# Patient Record
Sex: Female | Born: 1945 | Race: White | Hispanic: No | Smoking: Never smoker
Health system: Southern US, Community
[De-identification: ages and names within clinical notes are randomized; demographics above are authoritative.]

## PROBLEM LIST (undated history)

## (undated) DIAGNOSIS — M2550 Pain in unspecified joint: Secondary | ICD-10-CM

## (undated) DIAGNOSIS — I4892 Unspecified atrial flutter: Secondary | ICD-10-CM

## (undated) DIAGNOSIS — E041 Nontoxic single thyroid nodule: Secondary | ICD-10-CM

## (undated) DIAGNOSIS — M549 Dorsalgia, unspecified: Secondary | ICD-10-CM

## (undated) DIAGNOSIS — R531 Weakness: Secondary | ICD-10-CM

## (undated) DIAGNOSIS — E785 Hyperlipidemia, unspecified: Secondary | ICD-10-CM

## (undated) DIAGNOSIS — C4490 Unspecified malignant neoplasm of skin, unspecified: Secondary | ICD-10-CM

## (undated) DIAGNOSIS — G4730 Sleep apnea, unspecified: Secondary | ICD-10-CM

## (undated) DIAGNOSIS — G8929 Other chronic pain: Secondary | ICD-10-CM

## (undated) DIAGNOSIS — Z9109 Other allergy status, other than to drugs and biological substances: Secondary | ICD-10-CM

## (undated) DIAGNOSIS — I10 Essential (primary) hypertension: Secondary | ICD-10-CM

## (undated) DIAGNOSIS — M1990 Unspecified osteoarthritis, unspecified site: Secondary | ICD-10-CM

## (undated) DIAGNOSIS — K219 Gastro-esophageal reflux disease without esophagitis: Secondary | ICD-10-CM

## (undated) DIAGNOSIS — Z860100 Personal history of colon polyps, unspecified: Secondary | ICD-10-CM

## (undated) HISTORY — DX: Nontoxic single thyroid nodule: E04.1

## (undated) HISTORY — PX: BREAST BIOPSY: SHX20

## (undated) HISTORY — DX: Sleep apnea, unspecified: G47.30

## (undated) HISTORY — DX: Other allergy status, other than to drugs and biological substances: Z91.09

## (undated) HISTORY — PX: BASAL CELL CARCINOMA EXCISION: SHX1214

## (undated) HISTORY — DX: Unspecified osteoarthritis, unspecified site: M19.90

## (undated) HISTORY — DX: Unspecified malignant neoplasm of skin, unspecified: C44.90

## (undated) HISTORY — PX: CERVICAL FUSION: SHX112

## (undated) HISTORY — PX: DILATION AND CURETTAGE OF UTERUS: SHX78

## (undated) HISTORY — PX: ESOPHAGOGASTRODUODENOSCOPY: SHX1529

## (undated) HISTORY — DX: Hyperlipidemia, unspecified: E78.5

## (undated) HISTORY — PX: BREAST SURGERY: SHX581

## (undated) HISTORY — PX: COLONOSCOPY: SHX174

## (undated) HISTORY — DX: Unspecified atrial flutter: I48.92

## (undated) HISTORY — DX: Essential (primary) hypertension: I10

---

## 2009-05-07 ENCOUNTER — Emergency Department: Payer: Self-pay | Admitting: Emergency Medicine

## 2009-05-14 ENCOUNTER — Ambulatory Visit: Payer: Self-pay | Admitting: General Surgery

## 2010-03-10 ENCOUNTER — Ambulatory Visit: Payer: Self-pay | Admitting: Gastroenterology

## 2010-10-26 ENCOUNTER — Ambulatory Visit: Payer: Self-pay | Admitting: Internal Medicine

## 2012-06-12 ENCOUNTER — Ambulatory Visit: Payer: Self-pay | Admitting: Otolaryngology

## 2012-06-12 IMAGING — US THYROID ULTRASOUND
1 series · 14 of 25 positions shown · non-contrast
Comparison: none

REASON FOR EXAM: yearly follow goiter
COMMENTS:

[Series 1: thyroid ultrasound · 0.09mm/px · 14 of 47 slices shown]
[im 1/47]
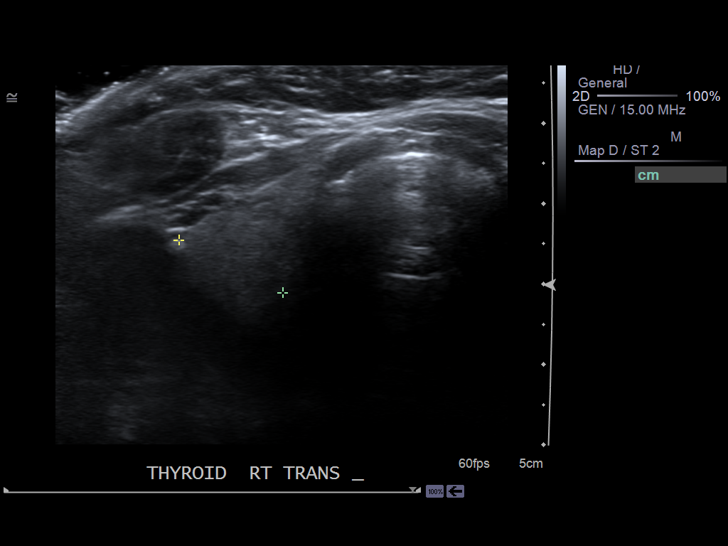
[im 4/47]
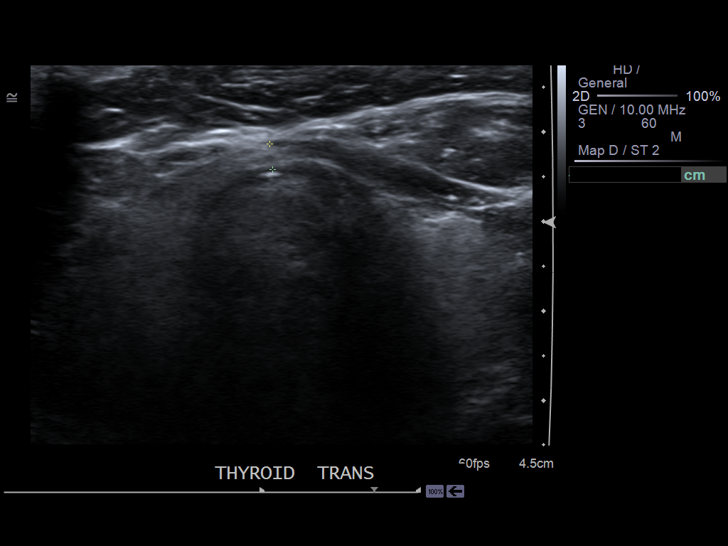
[im 8/47]
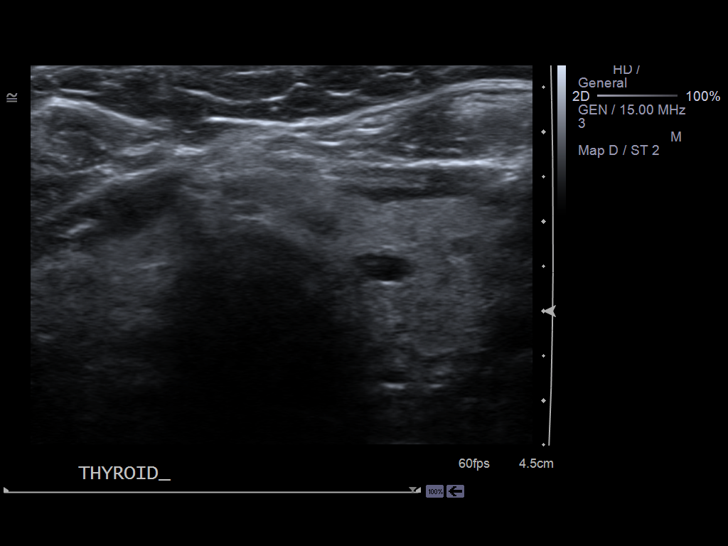
[im 12/47]
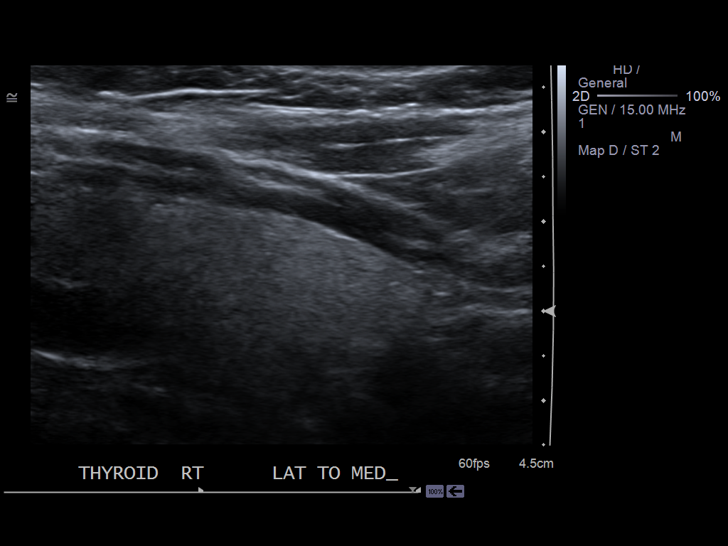
[im 16/47]
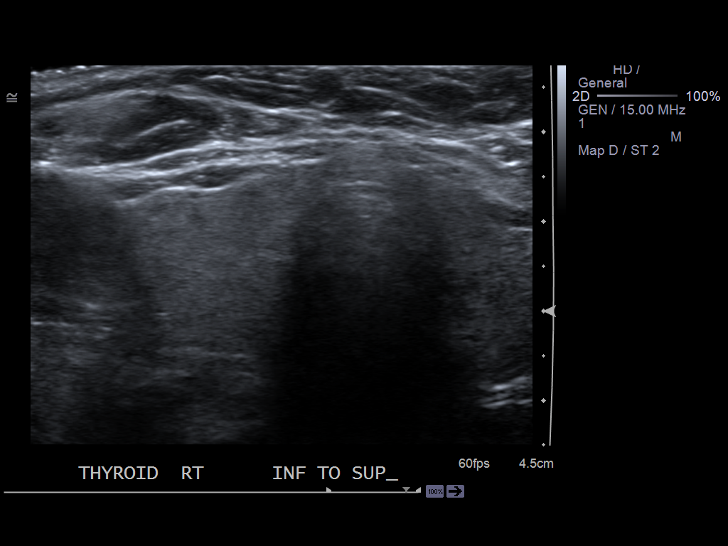
[im 18/47]
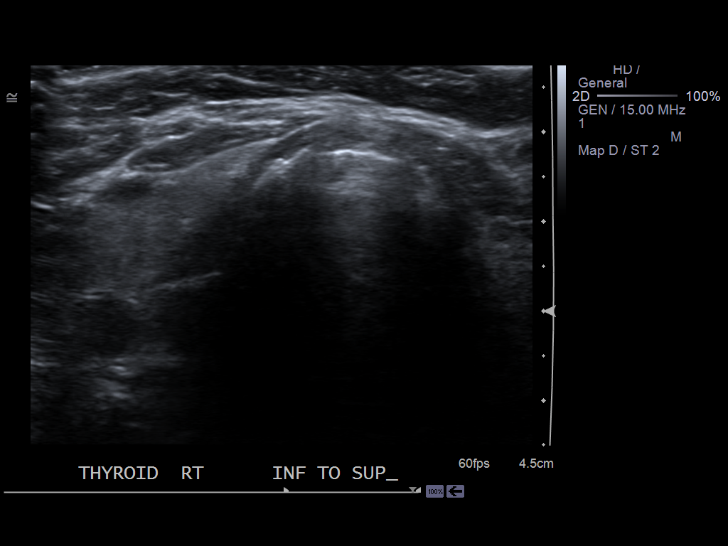
[im 22/47]
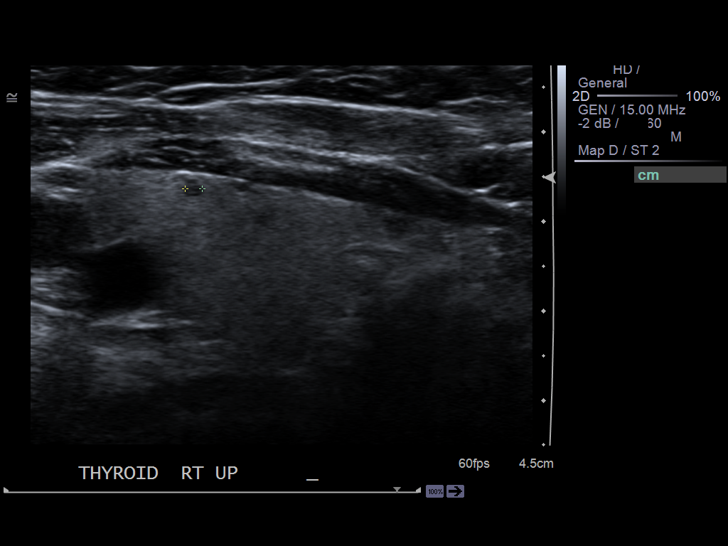
[im 25/47]
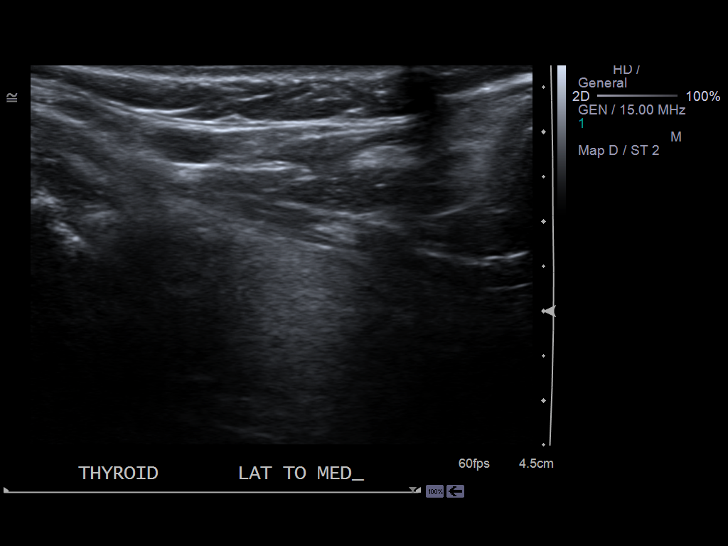
[im 29/47]
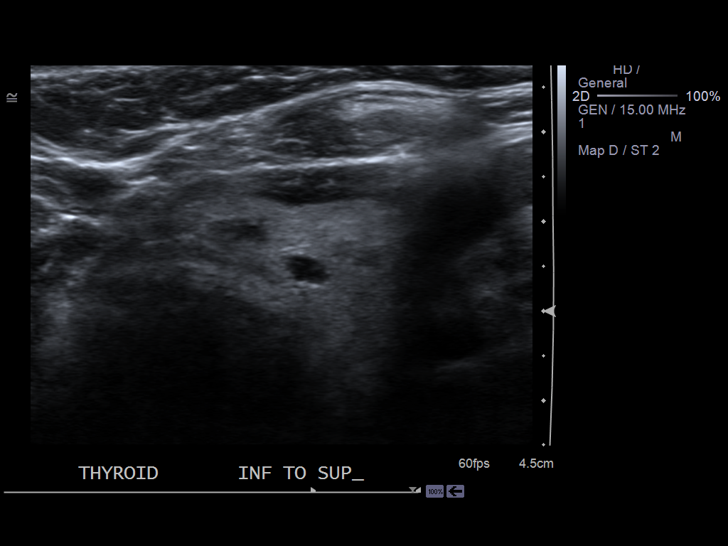
[im 31/47]
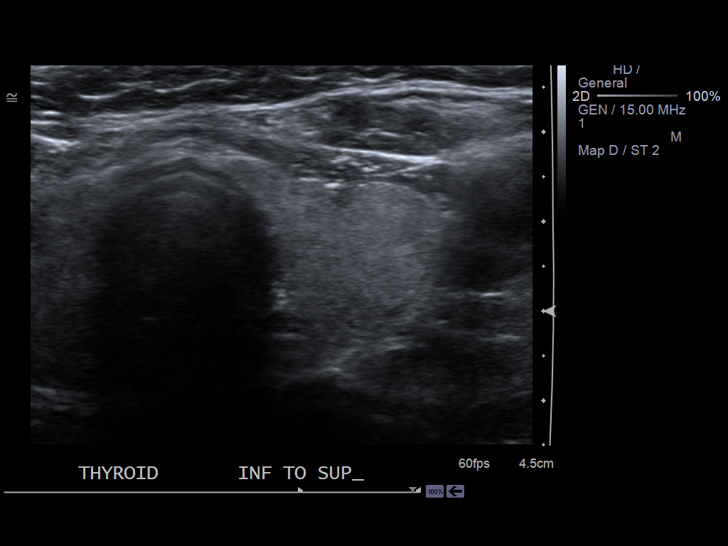
[im 35/47]
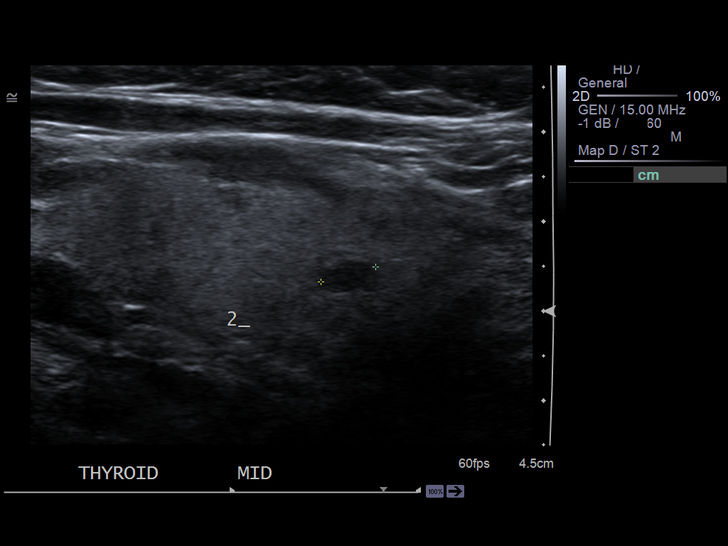
[im 39/47]
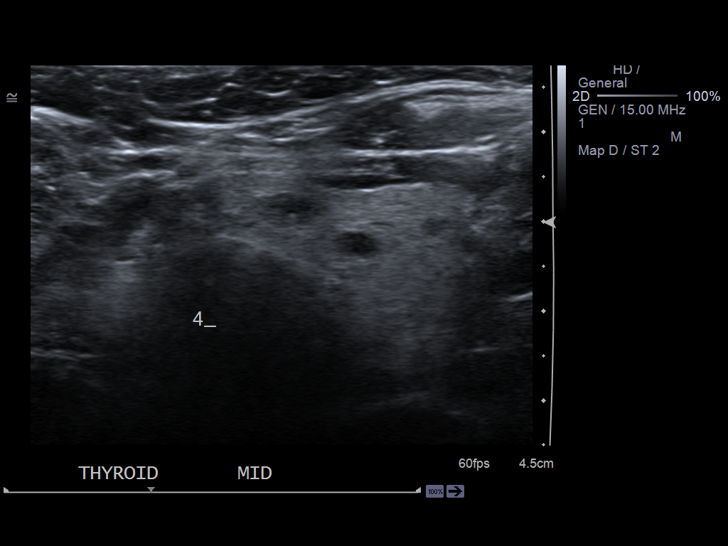
[im 43/47]
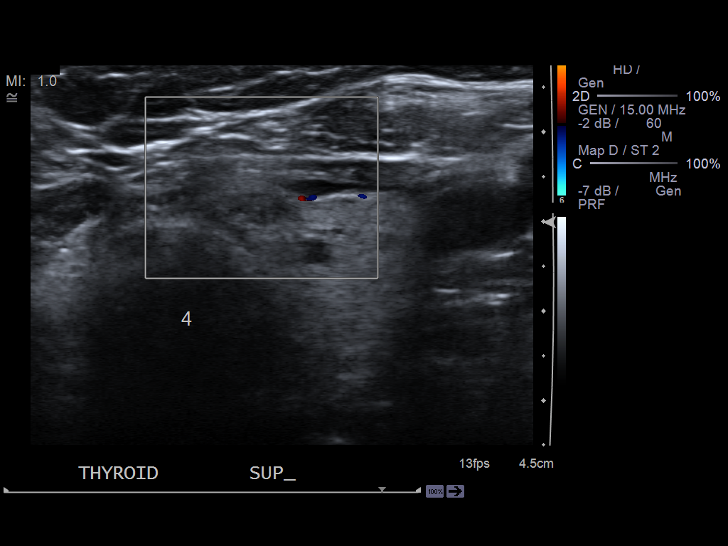
[im 47/47]
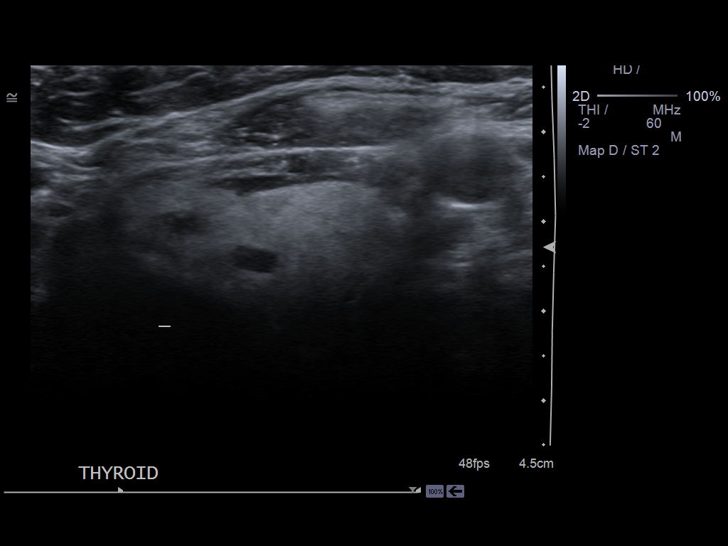

[14 of 25 positions shown; findings below may reference images not displayed]

PROCEDURE:     MILYN - MILYN SOFT TISSUE HEAD/NECK/THYROI  - [DATE] [DATE]

RESULT:     There are no previous studies for comparison.

The right thyroid lobe measures 4.2 x 1.4 x 1.3 cm. In the upper pole there
is a 3 mm maximal diameter anechoic focus with distal shadowing.

The left thyroid lobe measures 3.9 x 1.2 x 1.9 centimeters. In the medial
aspect of the upper pole there is a 7 x 4 x 6 mm diameter hypoechoic to
anechoic nodule. In the mid pole medially there is a 6 x 3 x 6 mm diameter
hypoechoic nodule. Lateral to this there is a 3 x 2 x 3 mm diameter
hypoechoic to anechoic nodule.

The thyroid isthmus measures 0.35 cm in thickness.
IMPRESSION: There are subcentimeter areas of nodularity demonstrated
which exhibit decreased echogenicity which may reflect tiny cysts. The
thyroid lobes are not abnormally enlarged.

[REDACTED]

## 2014-05-23 ENCOUNTER — Ambulatory Visit: Payer: Self-pay | Admitting: Cardiovascular Disease

## 2014-05-31 ENCOUNTER — Encounter: Payer: Self-pay | Admitting: *Deleted

## 2014-06-03 ENCOUNTER — Ambulatory Visit (INDEPENDENT_AMBULATORY_CARE_PROVIDER_SITE_OTHER): Payer: Medicare Other | Admitting: Cardiovascular Disease

## 2014-06-03 ENCOUNTER — Encounter: Payer: Self-pay | Admitting: Cardiovascular Disease

## 2014-06-03 VITALS — BP 139/80 | HR 64 | Ht 62.0 in | Wt 157.0 lb

## 2014-06-03 DIAGNOSIS — I471 Supraventricular tachycardia: Secondary | ICD-10-CM

## 2014-06-03 DIAGNOSIS — I10 Essential (primary) hypertension: Secondary | ICD-10-CM

## 2014-06-03 NOTE — Patient Instructions (Signed)
Medication Instructions:  None  Labwork: None  Testing/Procedures: None  Follow-Up: Your physician wants you to follow-up in: 1 year with Dr. Fletcher Anon. You will receive a reminder letter in the mail two months in advance. If you don't receive a letter, please call our office to schedule the follow-up appointment.   Any Other Special Instructions Will Be Listed Below (If Applicable).

## 2014-06-08 ENCOUNTER — Encounter: Payer: Self-pay | Admitting: Cardiovascular Disease

## 2014-06-08 DIAGNOSIS — I10 Essential (primary) hypertension: Secondary | ICD-10-CM | POA: Insufficient documentation

## 2014-06-08 DIAGNOSIS — I471 Supraventricular tachycardia, unspecified: Secondary | ICD-10-CM | POA: Insufficient documentation

## 2014-06-08 NOTE — Progress Notes (Signed)
Primary care physician: Dr. Clayborn Bigness  HPI  This is a 69 year old female who was referred for evaluation of tachycardia. She has known history of sleep apnea, hypertension, and hyperlipidemia. She reports intermittent episodes of tachycardia requiring emergency room visits in the past and termination with IV medications. However, she had no episodes in the last 5 years. It is listed as a field fibrillation. However, she is not aware of this. It seems from the description that it was supraventricular tachycardia. Ablation was discussed with her in the past but she hasn't had any episodes in the last 5 years. She denies any cardiac symptoms at the present time. She underwent an echocardiogram with Dr. Humphrey Rolls which showed normal LV systolic function with no significant valvular abnormalities. She had carotid Doppler done which showed no significant disease.  No Known Allergies   Current Outpatient Prescriptions on File Prior to Visit  Medication Sig Dispense Refill  . aspirin 81 MG tablet Take 81 mg by mouth every other day.     Marland Kitchen atorvastatin (LIPITOR) 40 MG tablet Take 40 mg by mouth daily.    . bisoprolol (ZEBETA) 5 MG tablet Take 5 mg by mouth daily.    Marland Kitchen losartan-hydrochlorothiazide (HYZAAR) 100-12.5 MG per tablet Take 1 tablet by mouth daily.    . pantoprazole (PROTONIX) 40 MG tablet Take 40 mg by mouth daily.    . traMADol (ULTRAM) 50 MG tablet Take 50 mg by mouth daily.      No current facility-administered medications on file prior to visit.     Past Medical History  Diagnosis Date  . Environmental allergies   . Arthritis   . Hyperlipidemia   . Hypertension   . Sleep apnea   . Atrial flutter   . Thyroid nodule   . Skin cancer      Past Surgical History  Procedure Laterality Date  . Breast surgery Bilateral   . Dilation and curettage of uterus    . Basal cell carcinoma excision      upper chest     Family History  Problem Relation Age of Onset  . Heart attack  Father      History   Social History  . Marital Status: Single    Spouse Name: N/A  . Number of Children: N/A  . Years of Education: N/A   Occupational History  . Not on file.   Social History Main Topics  . Smoking status: Never Smoker   . Smokeless tobacco: Not on file  . Alcohol Use: Yes  . Drug Use: No  . Sexual Activity: Not on file   Other Topics Concern  . Not on file   Social History Narrative     ROS A 10 point review of system was performed. It is negative other than that mentioned in the history of present illness.   PHYSICAL EXAM   BP 139/80 mmHg  Pulse 64  Ht 5\' 2"  (1.575 m)  Wt 157 lb (71.215 kg)  BMI 28.71 kg/m2 Constitutional: She is oriented to person, place, and time. She appears well-developed and well-nourished. No distress.  HENT: No nasal discharge.  Head: Normocephalic and atraumatic.  Eyes: Pupils are equal and round. No discharge.  Neck: Normal range of motion. Neck supple. No JVD present. No thyromegaly present.  Cardiovascular: Normal rate, regular rhythm, normal heart sounds. Exam reveals no gallop and no friction rub. No murmur heard.  Pulmonary/Chest: Effort normal and breath sounds normal. No stridor. No respiratory distress. She has  no wheezes. She has no rales. She exhibits no tenderness.  Abdominal: Soft. Bowel sounds are normal. She exhibits no distension. There is no tenderness. There is no rebound and no guarding.  Musculoskeletal: Normal range of motion. She exhibits no edema and no tenderness.  Neurological: She is alert and oriented to person, place, and time. Coordination normal.  Skin: Skin is warm and dry. No rash noted. She is not diaphoretic. No erythema. No pallor.  Psychiatric: She has a normal mood and affect. Her behavior is normal. Judgment and thought content normal.     SMO:LMBEM  Rhythm  -  Diffuse nonspecific T-abnormality.   ABNORMAL    ASSESSMENT AND PLAN

## 2014-06-08 NOTE — Assessment & Plan Note (Signed)
Blood pressure is well controlled on current medications. 

## 2014-06-08 NOTE — Assessment & Plan Note (Signed)
I suspect from her description that she likely has paroxysmal supraventricular tachycardia and not atrial fibrillation. She reports no episodes in the last 5 years. In the absence of documented atrial fibrillation or flutter, there is no indication for anticoagulation. Recent echocardiogram showed normal LV systolic function and atrial size. I think it's reasonable to monitor the patient for now with yearly EKGs. I will have her follow-up with me on a yearly basis or earlier if needed based on symptoms.

## 2015-08-25 ENCOUNTER — Ambulatory Visit: Payer: Self-pay | Admitting: Cardiovascular Disease

## 2015-09-16 ENCOUNTER — Telehealth: Payer: Self-pay | Admitting: Cardiovascular Disease

## 2015-09-16 NOTE — Telephone Encounter (Signed)
Received cardiac clearance from [facility] at [facility], [phone]; fax [phone]. Pt scheduled for right total hip replacement 12/09/15. Pt has appt w/Dr. Fletcher Anon 8/8. Clearance form on Sharon's desk.

## 2015-09-23 ENCOUNTER — Ambulatory Visit (INDEPENDENT_AMBULATORY_CARE_PROVIDER_SITE_OTHER): Payer: Medicare Other | Admitting: Cardiovascular Disease

## 2015-09-23 ENCOUNTER — Encounter (INDEPENDENT_AMBULATORY_CARE_PROVIDER_SITE_OTHER): Payer: Self-pay

## 2015-09-23 ENCOUNTER — Encounter: Payer: Self-pay | Admitting: Cardiovascular Disease

## 2015-09-23 VITALS — BP 140/70 | HR 70 | Ht 62.0 in | Wt 165.0 lb

## 2015-09-23 DIAGNOSIS — R079 Chest pain, unspecified: Secondary | ICD-10-CM

## 2015-09-23 DIAGNOSIS — I471 Supraventricular tachycardia: Secondary | ICD-10-CM | POA: Diagnosis not present

## 2015-09-23 DIAGNOSIS — Z01818 Encounter for other preprocedural examination: Secondary | ICD-10-CM | POA: Diagnosis not present

## 2015-09-23 NOTE — Telephone Encounter (Signed)
Pt has lexi myoview scheduled 8/21. Clearance in Sharon's basket

## 2015-09-23 NOTE — Progress Notes (Signed)
Cardiology Office Note   Date:  09/23/2015   ID:  Stephanie Chavez, DOB 1945/05/05, MRN [id number]  PCP:  Lavera Guise, MD  Cardiologist:   Kathlyn Sacramento, MD   Chief Complaint  Patient presents with  . Other    Pt needs cardiac clearance C/o edema swelling feet. Meds reviewed verbally with pt.      History of Present Illness: Stephanie Chavez is a 70 y.o. female who presents for a follow up visit regarding suspected paroxysmal supraventricular tachycardia. She has known history of sleep apnea, hypertension, and hyperlipidemia. She reports intermittent episodes of tachycardia requiring emergency room visits in the past and termination with IV medications. However, she had no episodes in the last 5 years. Ablation was discussed with her in the past but she hasn't had any episodes in the last 5 years. She underwent an echocardiogram in 2016 with Dr. Humphrey Rolls which showed normal LV systolic function with no significant valvular abnormalities. She had carotid Doppler done which showed no significant disease.  She is planning to undergo right hip replacement in October and she is here for preoperative cardiovascular evaluation. She reports that her palpitations are usually self-limiting with no prolonged episodes of tachycardia. She does complain of intermittent episodes of substernal chest tightness if she goes up one flight of stairs. This has not been consistent. She has sleep apnea and has been using CPAP.   Past Medical History:  Diagnosis Date  . Arthritis   . Atrial flutter ([place])   . Environmental allergies   . Hyperlipidemia   . Hypertension   . Skin cancer   . Sleep apnea   . Thyroid nodule     Past Surgical History:  Procedure Laterality Date  . BASAL CELL CARCINOMA EXCISION     upper chest  . BREAST SURGERY Bilateral   . DILATION AND CURETTAGE OF UTERUS       Current Outpatient Prescriptions  Medication Sig Dispense Refill  . acetaminophen (TYLENOL) 650 MG CR tablet Take  1,300 mg by mouth daily.    Marland Kitchen atorvastatin (LIPITOR) 40 MG tablet Take 40 mg by mouth daily.    . bisoprolol (ZEBETA) 5 MG tablet Take 5 mg by mouth daily.    Marland Kitchen losartan-hydrochlorothiazide (HYZAAR) 100-12.5 MG per tablet Take 1 tablet by mouth daily.    . pantoprazole (PROTONIX) 40 MG tablet Take 40 mg by mouth daily.     No current facility-administered medications for this visit.     Allergies:   Review of patient's allergies indicates no known allergies.    Social History:  The patient  reports that she has never smoked. She does not have any smokeless tobacco history on file. She reports that she drinks alcohol. She reports that she does not use drugs.   Family History:  The patient's family history includes Heart attack in her father.    ROS:  Please see the history of present illness.   Otherwise, review of systems are positive for none.   All other systems are reviewed and negative.    PHYSICAL EXAM: VS:  BP 140/70 (BP Location: Left Arm, Patient Position: Sitting, Cuff Size: Normal)   Pulse 70   Ht 5\' 2"  (1.575 m)   Wt 165 lb (74.8 kg)   BMI 30.18 kg/m  , BMI Body mass index is 30.18 kg/m. GEN: Well nourished, well developed, in no acute distress  HEENT: normal  Neck: no JVD, carotid bruits, or masses Cardiac: RRR; no rubs, or gallops,no  edema . One out of 6 systolic murmur in the aortic area Respiratory:  clear to auscultation bilaterally, normal work of breathing GI: soft, nontender, nondistended, + BS MS: no deformity or atrophy  Skin: warm and dry, no rash Neuro:  Strength and sensation are intact Psych: euthymic mood, full affect   EKG:  EKG is ordered today. The ekg ordered today demonstrates normal sinus rhythm with nonspecific T wave changes.   Recent Labs: No results found for requested labs within last 8760 hours.    Lipid Panel No results found for: CHOL, TRIG, HDL, CHOLHDL, VLDL, LDLCALC, LDLDIRECT    Wt Readings from Last 3 Encounters:    09/23/15 165 lb (74.8 kg)  06/03/14 157 lb (71.2 kg)         ASSESSMENT AND PLAN:  1.  Preoperative cardiovascular evaluation for hip surgery: The patient reports intermittent episodes of substernal chest tightness mostly with over exertion. EKG does not show any acute changes. Her functional capacity has been also somewhat limited due to her arthritis. Thus, I recommend evaluation with a pharmacologic nuclear stress test before surgery. She is not able to exercise on a treadmill.    2. Paroxysmal supraventricular tachycardia: no prolonged episodes of tachycardia. Continue small dose bisoprolol.   2. Essential hypertension: blood pressure is reasonably controlled on current medications.     Disposition:   FU with me in 1 year  Signed,  Kathlyn Sacramento, MD  09/23/2015 5:57 PM    [place]

## 2015-09-23 NOTE — Patient Instructions (Addendum)
Medication Instructions:  Your physician recommends that you continue on your current medications as directed. Please refer to the Current Medication list given to you today.   Labwork: none  Testing/Procedures: Your physician has requested that you have a lexiscan myoview. For further information please visit [email or web address]. Please follow instruction sheet, as given.  [place]  Your caregiver has ordered a Stress Test with nuclear imaging. The purpose of this test is to evaluate the blood supply to your heart muscle. This procedure is referred to as a "Non-Invasive Stress Test." This is because other than having an IV started in your vein, nothing is inserted or "invades" your body. Cardiac stress tests are done to find areas of poor blood flow to the heart by determining the extent of coronary artery disease (CAD). Some patients exercise on a treadmill, which naturally increases the blood flow to your heart, while others who are  unable to walk on a treadmill due to physical limitations have a pharmacologic/chemical stress agent called Lexiscan . This medicine will mimic walking on a treadmill by temporarily increasing your coronary blood flow.   Please note: these test may take anywhere between 2-4 hours to complete  PLEASE REPORT TO [place] AT THE FIRST DESK WILL DIRECT YOU WHERE TO GO  Date of Procedure: Monday, Aug 21  Arrival Time for Procedure:  8:15am  Instructions regarding medication:     _xx___:  Hold bisoprolol night before procedure and morning of procedure   PLEASE NOTIFY THE OFFICE AT LEAST 24 HOURS IN ADVANCE IF YOU ARE UNABLE TO KEEP YOUR APPOINTMENT.  [phone] AND  PLEASE NOTIFY NUCLEAR MEDICINE AT [facility] AT LEAST 24 HOURS IN ADVANCE IF YOU ARE UNABLE TO KEEP YOUR APPOINTMENT. [phone]  How to prepare for your Myoview test:  1. Do not eat or drink after midnight 2. No caffeine for 24 hours prior to test 3. No  smoking 24 hours prior to test. 4. Your medication may be taken with water.  If your doctor stopped a medication because of this test, do not take that medication. 5. Ladies, please do not wear dresses.  Skirts or pants are appropriate. Please wear a short sleeve shirt. 6. No perfume, cologne or lotion. 7. Wear comfortable walking shoes. No heels!            Follow-Up: Your physician wants you to follow-up in: one year with Dr. Fletcher Anon.  You will receive a reminder letter in the mail two months in advance. If you don't receive a letter, please call our office to schedule the follow-up appointment.   Any Other Special Instructions Will Be Listed Below (If Applicable).     If you need a refill on your cardiac medications before your next appointment, please call your pharmacy.  Cardiac Nuclear Scanning A cardiac nuclear scan is used to check your heart for problems, such as the following:  A portion of the heart is not getting enough blood.  Part of the heart muscle has died, which happens with a heart attack.  The heart wall is not working normally.  In this test, a radioactive dye (tracer) is injected into your bloodstream. After the tracer has traveled to your heart, a scanning device is used to measure how much of the tracer is absorbed by or distributed to various areas of your heart. LET [facility] CARE PROVIDER KNOW ABOUT:  Any allergies you have.  All medicines you are taking, including vitamins, herbs, eye drops, creams,  and over-the-counter medicines.  Previous problems you or members of your family have had with the use of anesthetics.  Any blood disorders you have.  Previous surgeries you have had.  Medical conditions you have.  RISKS AND COMPLICATIONS Generally, this is a safe procedure. However, as with any procedure, problems can occur. Possible problems include:   Serious chest pain.  Rapid heartbeat.  Sensation of warmth in your chest. This usually  passes quickly. BEFORE THE PROCEDURE Ask your health care provider about changing or stopping your regular medicines. PROCEDURE This procedure is usually done at a hospital and takes 2-4 hours.  An IV tube is inserted into one of your veins.  Your health care provider will inject a small amount of radioactive tracer through the tube.  You will then wait for 20-40 minutes while the tracer travels through your bloodstream.  You will lie down on an exam table so images of your heart can be taken. Images will be taken for about 15-20 minutes.  You will exercise on a treadmill or stationary bike. While you exercise, your heart activity will be monitored with an electrocardiogram (ECG), and your blood pressure will be checked.  If you are unable to exercise, you may be given a medicine to make your heart beat faster.  When blood flow to your heart has peaked, tracer will again be injected through the IV tube.  After 20-40 minutes, you will get back on the exam table and have more images taken of your heart.  When the procedure is over, your IV tube will be removed. AFTER THE PROCEDURE  You will likely be able to leave shortly after the test. Unless your health care provider tells you otherwise, you may return to your normal schedule, including diet, activities, and medicines.  Make sure you find out how and when you will get your test results.   This information is not intended to replace advice given to you by your health care provider. Make sure you discuss any questions you have with your health care provider.   Document Released: 02/27/2004 Document Revised: 02/06/2013 Document Reviewed: 01/10/2013 Elsevier Interactive Patient Education [employer].

## 2015-10-02 ENCOUNTER — Telehealth: Payer: Self-pay | Admitting: Cardiovascular Disease

## 2015-10-02 NOTE — Telephone Encounter (Signed)
Reviewed lexi myoview instructions with patient who verbalized understanding. She had no questions at this time.

## 2015-10-06 ENCOUNTER — Encounter
Admission: RE | Admit: 2015-10-06 | Discharge: 2015-10-06 | Disposition: A | Discharge status: Home / Self Care | Payer: Medicare Other | Source: Ambulatory Visit | Attending: Cardiovascular Disease | Admitting: Cardiovascular Disease

## 2015-10-06 DIAGNOSIS — R079 Chest pain, unspecified: Secondary | ICD-10-CM | POA: Insufficient documentation

## 2015-10-06 DIAGNOSIS — Z01818 Encounter for other preprocedural examination: Secondary | ICD-10-CM | POA: Insufficient documentation

## 2015-10-06 LAB — NM MYOCAR MULTI W/SPECT W/WALL MOTION / EF
CHL CUP RESTING HR STRESS: 62 {beats}/min
CSEPED: 0 min
CSEPEDS: 0 s
CSEPEW: 1 METS
CSEPHR: 62 %
LV dias vol: 60 mL (ref 46–106)
LV sys vol: 29 mL
MPHR: 150 {beats}/min
Peak HR: 93 {beats}/min
SDS: 1
SRS: 3
SSS: 1
TID: 1.22

## 2015-10-06 MED ORDER — TECHNETIUM TC 99M TETROFOSMIN IV KIT
30.0000 | PACK | Freq: Once | INTRAVENOUS | Status: AC | PRN
  Administered 2015-10-06: 30.469 via INTRAVENOUS

## 2015-10-06 MED ORDER — REGADENOSON 0.4 MG/5ML IV SOLN
0.4000 mg | Freq: Once | INTRAVENOUS | Status: AC
  Administered 2015-10-06: 0.4 mg via INTRAVENOUS

## 2015-10-06 MED ORDER — TECHNETIUM TC 99M TETROFOSMIN IV KIT
13.3400 | PACK | Freq: Once | INTRAVENOUS | Status: AC | PRN
  Administered 2015-10-06: 13.34 via INTRAVENOUS

## 2015-10-07 NOTE — Telephone Encounter (Signed)
Routed clearance letter, OV notes and EKG to [facility] @ [place], [phone]

## 2015-10-17 HISTORY — PX: OTHER SURGICAL HISTORY: SHX169

## 2015-11-24 DIAGNOSIS — Z9989 Dependence on other enabling machines and devices: Secondary | ICD-10-CM

## 2015-11-24 DIAGNOSIS — E785 Hyperlipidemia, unspecified: Secondary | ICD-10-CM

## 2015-11-24 DIAGNOSIS — M1611 Unilateral primary osteoarthritis, right hip: Secondary | ICD-10-CM

## 2015-11-24 DIAGNOSIS — G4733 Obstructive sleep apnea (adult) (pediatric): Secondary | ICD-10-CM

## 2015-11-24 NOTE — H&P (Signed)
PREOPERATIVE H&P Patient ID: Raqueal Marietta MRN: [id number] DOB/AGE: 1945-04-23 70 y.o.  Chief Complaint: OA RIGHT HIP  Planned Procedure Date: 12/09/15  Medical Clearance by Dr. Humphrey Rolls   Cardiac Clearance by Dr. Audelia Acton   HPI: Brendy Stitzel is a 70 y.o. female who presents for evaluation of OA RIGHT HIP. The patient has a history of pain and functional disability in the right hip due to arthritis and has failed non-surgical conservative treatments for greater than 12 weeks to include Tylenol, NSAID's and/or analgesics, corticosteriod injections and activity modification.  Onset of symptoms was gradual, starting 3 years ago with gradually worsening course since that time.  Patient currently rates pain at 6 out of 10 with activity. Patient has night pain, worsening of pain with activity and weight bearing and pain that interferes with activities of daily living.  Patient has severe osteoarthritis of the right hip with evidence of subchondral cysts, periarticular osteophytes and joint space narrowing by imaging studies. There is no active infection.  Past Medical History:  Diagnosis Date  . Arthritis   . Atrial flutter ([place])   . Environmental allergies   . Hyperlipidemia   . Hypertension   . Skin cancer   . Sleep apnea   . Thyroid nodule    Past Surgical History:  Procedure Laterality Date  . BASAL CELL CARCINOMA EXCISION     upper chest  . BREAST SURGERY Bilateral   . DILATION AND CURETTAGE OF UTERUS     No Known Allergies Prior to Admission medications   Medication Sig Start Date End Date Taking? Authorizing Provider  acetaminophen (TYLENOL) 650 MG CR tablet Take 1,300 mg by mouth daily.    Historical Provider, MD  atorvastatin (LIPITOR) 40 MG tablet Take 40 mg by mouth daily.    Historical Provider, MD  bisoprolol (ZEBETA) 5 MG tablet Take 5 mg by mouth daily.    Historical Provider, MD  losartan-hydrochlorothiazide (HYZAAR) 100-12.5 MG per tablet Take 1 tablet by mouth daily.     Historical Provider, MD  pantoprazole (PROTONIX) 40 MG tablet Take 40 mg by mouth daily.    Historical Provider, MD   Social History   Social History  . Marital status: Single    Spouse name: N/A  . Number of children: N/A  . Years of education: N/A   Social History Main Topics  . Smoking status: Never Smoker  . Smokeless tobacco: Not on file  . Alcohol use Yes  . Drug use: No  . Sexual activity: Not on file   Other Topics Concern  . Not on file   Social History Narrative  . No narrative on file   Family History  Problem Relation Age of Onset  . Heart attack Father     ROS: Currently denies lightheadedness, dizziness, Fever, chills, CP, SOB.   No personal history of DVT, PE, MI, or CVA. No loose teeth or dentures All other systems have been reviewed and were otherwise currently negative with the exception of those mentioned in the HPI and as above.  Objective: Vitals: Ht: 5'1.5" Wt: 162 Temp: 97.9 BP: 139/72 Pulse: 63 O2 98% on room air. Physical Exam: General: Alert, NAD.  Trendelenberg Gait utilizes a cane HEENT: EOMI, Good Neck Extension  Pulm: No increased work of breathing.  Clear B/L A/P w/o crackle or wheeze.  CV: RRR, No m/g/r appreciated  GI: soft, NT, ND Neuro: Neuro grossly intact b/l upper/lower ext.  Sensation intact distally Skin: No lesions in the area of chief complaint  MSK/Surgical Site: Hip Non tender over greater trochanter.  Pain with passive ROM.  Positive Stinchfield.  5/5 strength.  NVI.  Sensation intact distally.  Imaging Review Plain radiographs demonstrate severe degenerative joint disease of the right hip.    Assessment: OA RIGHT HIP Principal Problem:   Primary osteoarthritis of right hip Active Problems:   Paroxysmal supraventricular tachycardia (HCC)   Essential hypertension   OSA on CPAP   Hyperlipidemia   Plan: Plan for Procedure(s): RIGHT TOTAL HIP ARTHROPLASTY ANTERIOR APPROACH  The patient history, physical exam,  clinical judgement of the provider and imaging are consistent with end stage degenerative joint disease and total joint arthroplasty is deemed medically necessary. The treatment options including medical management, injection therapy, and arthroplasty were discussed at length. The risks and benefits of Procedure(s): RIGHT TOTAL HIP ARTHROPLASTY ANTERIOR APPROACH were presented and reviewed.  The risks of nonoperative treatment, versus surgical intervention including but not limited to continued pain, aseptic loosening, stiffness, dislocation/subluxation, infection, bleeding, nerve injury, blood clots, cardiopulmonary complications, morbidity, mortality, among others were discussed. The patient verbalizes understanding and wishes to proceed with the plan.  Patient is being admitted for inpatient treatment for surgery, pain control, PT, OT, prophylactic antibiotics, VTE prophylaxis, progressive ambulation, ADL's and discharge planning.   Dental prophylaxis discussed and recommended for 2 years postoperatively.  The patient does meet the criteria for TXA which will be used perioperatively via IV.   Xarelto will be used postoperatively for DVT prophylaxis in addition to SCDs, and early ambulation. The patient is planning to be discharged home with home health services in care of her significant other Burnard Leigh.  Charna Elizabeth Martensen III,PA-C 11/24/2015 1:30 PM

## 2015-11-28 ENCOUNTER — Encounter (HOSPITAL_COMMUNITY): Payer: Self-pay | Admitting: *Deleted

## 2015-11-28 ENCOUNTER — Encounter (HOSPITAL_COMMUNITY)
Admission: RE | Admit: 2015-11-28 | Discharge: 2015-11-28 | Disposition: A | Discharge status: Home / Self Care | Payer: Medicare Other | Source: Ambulatory Visit | Attending: Orthopedic Surgery | Admitting: Orthopedic Surgery

## 2015-11-28 DIAGNOSIS — Z01818 Encounter for other preprocedural examination: Secondary | ICD-10-CM | POA: Insufficient documentation

## 2015-11-28 HISTORY — DX: Personal history of colonic polyps: Z86.010

## 2015-11-28 HISTORY — DX: Weakness: R53.1

## 2015-11-28 HISTORY — DX: Dorsalgia, unspecified: M54.9

## 2015-11-28 HISTORY — DX: Other chronic pain: G89.29

## 2015-11-28 HISTORY — DX: Pain in unspecified joint: M25.50

## 2015-11-28 HISTORY — DX: Personal history of colon polyps, unspecified: Z86.0100

## 2015-11-28 HISTORY — DX: Gastro-esophageal reflux disease without esophagitis: K21.9

## 2015-11-28 LAB — URINE MICROSCOPIC-ADD ON: RBC / HPF: NONE SEEN RBC/hpf (ref 0–5)

## 2015-11-28 LAB — PROTIME-INR
INR: 0.91
PROTHROMBIN TIME: 12.3 s (ref 11.4–15.2)

## 2015-11-28 LAB — CBC
HCT: 34.8 % — ABNORMAL LOW (ref 36.0–46.0)
HEMOGLOBIN: 11.5 g/dL — AB (ref 12.0–15.0)
MCH: 31.5 pg (ref 26.0–34.0)
MCHC: 33 g/dL (ref 30.0–36.0)
MCV: 95.3 fL (ref 78.0–100.0)
Platelets: 239 10*3/uL (ref 150–400)
RBC: 3.65 MIL/uL — AB (ref 3.87–5.11)
RDW: 13.4 % (ref 11.5–15.5)
WBC: 5.8 10*3/uL (ref 4.0–10.5)

## 2015-11-28 LAB — URINALYSIS, ROUTINE W REFLEX MICROSCOPIC
BILIRUBIN URINE: NEGATIVE
GLUCOSE, UA: NEGATIVE mg/dL
Hgb urine dipstick: NEGATIVE
KETONES UR: NEGATIVE mg/dL
NITRITE: NEGATIVE
PH: 6 (ref 5.0–8.0)
Protein, ur: NEGATIVE mg/dL
Specific Gravity, Urine: 1.017 (ref 1.005–1.030)

## 2015-11-28 LAB — APTT: aPTT: 26 seconds (ref 24–36)

## 2015-11-28 LAB — BASIC METABOLIC PANEL
ANION GAP: 10 (ref 5–15)
BUN: 16 mg/dL (ref 6–20)
CALCIUM: 10.4 mg/dL — AB (ref 8.9–10.3)
CHLORIDE: 105 mmol/L (ref 101–111)
CO2: 26 mmol/L (ref 22–32)
Creatinine, Ser: 0.9 mg/dL (ref 0.44–1.00)
GFR calc non Af Amer: >60 mL/min (ref >60)
Glucose, Bld: 114 mg/dL — ABNORMAL HIGH (ref 65–99)
POTASSIUM: 3.7 mmol/L (ref 3.5–5.1)
Sodium: 141 mmol/L (ref 135–145)

## 2015-11-28 LAB — ABO/RH: ABO/RH(D): A POS

## 2015-11-28 LAB — TYPE AND SCREEN
ABO/RH(D): A POS
ANTIBODY SCREEN: NEGATIVE

## 2015-11-28 LAB — SURGICAL PCR SCREEN
MRSA, PCR: NEGATIVE
STAPHYLOCOCCUS AUREUS: NEGATIVE

## 2015-11-28 MED ORDER — CHLORHEXIDINE GLUCONATE 4 % EX LIQD: 60.0000 mL | Freq: Once | CUTANEOUS | Status: DC

## 2015-11-28 NOTE — Pre-Procedure Instructions (Signed)
Stephanie Chavez  11/28/2015      [facility] [place], [place] - [address] [place] [address] Phone: [phone] Fax: [phone]    Your procedure is scheduled on Tues, Oct 24 @ 7:30 AM  Report to [facility] Admitting at 5:30 AM  Call this number if you have problems the morning of surgery:  [phone]   Remember:  Do not eat food or drink liquids after midnight.  Take these medicines the morning of surgery with A SIP OF WATER Bisoprolol(Zebeta) and Pantoprazole(Protonix)             No Goody's,BC's,Aleve,Advil,Motrin,Ibuprofen,Fish Oil,or any Herbal Medications.    Do not wear jewelry, make-up or nail polish.  Do not wear lotions, powders,perfumes, or deoderant.  Do not shave 48 hours prior to surgery.  Do not bring valuables to the hospital.  [facility] is not responsible for any belongings or valuables.  Contacts, dentures or bridgework may not be worn into surgery.  Leave your suitcase in the car.  After surgery it may be brought to your room.  For patients admitted to the hospital, discharge time will be determined by your treatment team.  Patients discharged the day of surgery will not be allowed to drive home.    [facility]  Before surgery, you can play an important role.  Because skin is not sterile, your skin needs to be as free of germs as possible.  You can reduce the number of germs on you skin by washing with CHG (chlorahexidine gluconate) soap before surgery.  CHG is an antiseptic cleaner which kills germs and bonds with the skin to continue killing germs even after washing.  Please DO NOT use if you have an allergy to CHG or antibacterial soaps.  If your skin becomes reddened/irritated stop using the CHG and inform your nurse when you arrive at Short Stay.  Do not shave (including legs and underarms) for at least 48 hours prior to the first CHG shower.  You may shave  your face.  Please follow these instructions carefully:   1.  Shower with CHG Soap the night before surgery and the                                morning of Surgery.  2.  If you choose to wash your hair, wash your hair first as usual with your       normal shampoo.  3.  After you shampoo, rinse your hair and body thoroughly to remove the                      Shampoo.  4.  Use CHG as you would any other liquid soap.  You can apply chg directly       to the skin and wash gently with scrungie or a clean washcloth.  5.  Apply the CHG Soap to your body ONLY FROM THE NECK DOWN.        Do not use on open wounds or open sores.  Avoid contact with your eyes,       ears, mouth and genitals (private parts).  Wash genitals (private parts)       with your normal soap.  6.  Wash thoroughly, paying special attention to the area where your surgery        will be  performed.  7.  Thoroughly rinse your body with warm water from the neck down.  8.  DO NOT shower/wash with your normal soap after using and rinsing off       the CHG Soap.  9.  Pat yourself dry with a clean towel.            10.  Wear clean pajamas.            11.  Place clean sheets on your bed the night of your first shower and do not        sleep with pets.  Day of Surgery  Do not apply any lotions/deoderants the morning of surgery.  Please wear clean clothes to the hospital/surgery center.   Please read over the following fact sheets that you were given. Pain Booklet, Coughing and Deep Breathing, MRSA Information and Surgical Site Infection Prevention

## 2015-11-28 NOTE — Progress Notes (Signed)
Dr.Arida is cardiologist with last visit in July 2017  Stress test report in epic from 10-06-15  Echo 4-5 yrs ago  Heart cath denies  EKG in epic from 09-23-15  CXR denies in past yr  Medical Md is Dr.Khan

## 2015-11-30 LAB — URINE CULTURE: Culture: >=100000 — AB

## 2015-12-01 NOTE — Progress Notes (Signed)
Anesthesia Chart Review:  Pt is a 70 year old female scheduled for R total hip arthroplasty anterior approach on 12/09/2015 with Edmonia Lynch, MD.   - Cardiologist is Kathlyn Sacramento, MD who cleared pt for surgery in comment on stress test results.  - PCP is Clayborn Bigness, MD.   PMH includes:  Atrial flutter, HTN, hyperlipidemia, OSA, thyroid nodule, GERD.  Never smoker. BMI 30  Medications include: lipitor, bisoprolol, losartan-hctz, protonix.   Preoperative labs reviewed.  Urine culture shows UTI. I left voicemail for [facility] in Dr. Debroah Loop office about UTI.   EKG 09/23/15: NSR. Nonspecific T wave abnormality.   Nuclear stress test 10/06/15:   There was no ST segment deviation noted during stress.  The study is normal.  This is a low risk study.  The left ventricular ejection fraction is normal (55-65%).  If no changes, I anticipate pt can proceed with surgery as scheduled.   Willeen Cass, FNP-BC [facility] Short Stay Surgical Center/Anesthesiology Phone: [phone] 12/01/2015 1:54 PM

## 2015-12-08 MED ORDER — TRANEXAMIC ACID 1000 MG/10ML IV SOLN
1000.0000 mg | INTRAVENOUS | Status: AC
  Administered 2015-12-09: 1000 mg via INTRAVENOUS
  Filled 2015-12-08: qty 10

## 2015-12-08 MED ORDER — DEXTROSE 5 % IV SOLN
3.0000 g | INTRAVENOUS | Status: DC
  Filled 2015-12-08: qty 3000

## 2015-12-08 NOTE — Progress Notes (Signed)
Attempted to call for echo and Carotid dopplers results, but the office is closed until 2pm.

## 2015-12-08 NOTE — Anesthesia Preprocedure Evaluation (Addendum)
Anesthesia Evaluation  Patient identified by MRN, date of birth, ID band Patient awake    Reviewed: Allergy & Precautions, NPO status , Patient's Chart, lab work & pertinent test results, reviewed documented beta blocker date and time   Airway Mallampati: III  TM Distance: >3 FB Neck ROM: Full    Dental  (+) Teeth Intact, Dental Advisory Given   Pulmonary sleep apnea and Continuous Positive Airway Pressure Ventilation ,    Pulmonary exam normal breath sounds clear to auscultation       Cardiovascular hypertension, Pt. on home beta blockers and Pt. on medications (-) angina(-) CAD and (-) Past MI Normal cardiovascular exam+ dysrhythmias Atrial Fibrillation  Rhythm:Regular Rate:Normal  EKG 09/23/15: NSR. Nonspecific T wave abnormality.   Nuclear stress test 10/06/15:  There was no ST segment deviation noted during stress. The study is normal. This is a low risk study. The left ventricular ejection fraction is normal (55-65%).   Neuro/Psych S/p ACDF negative psych ROS   GI/Hepatic Neg liver ROS, GERD  Medicated,  Endo/Other  Obesity   Renal/GU negative Renal ROS     Musculoskeletal  (+) Arthritis ,   Abdominal   Peds  Hematology  (+) Blood dyscrasia, anemia ,   Anesthesia Other Findings Day of surgery medications reviewed with the patient.  Reproductive/Obstetrics                            Anesthesia Physical Anesthesia Plan  ASA: II  Anesthesia Plan: Spinal and MAC   Post-op Pain Management:    Induction:   Airway Management Planned: Nasal Cannula and Simple Face Mask  Additional Equipment:   Intra-op Plan:   Post-operative Plan:   Informed Consent: I have reviewed the patients History and Physical, chart, labs and discussed the procedure including the risks, benefits and alternatives for the proposed anesthesia with the patient or authorized representative who has  indicated his/her understanding and acceptance.   Dental advisory given  Plan Discussed with: CRNA, Anesthesiologist and Surgeon  Anesthesia Plan Comments: (Discussed risks and benefits of and differences between spinal and general. Discussed risks of spinal including headache, backache, failure, bleeding, infection, and nerve damage. Patient consents to spinal. Questions answered. Coagulation studies and platelet count acceptable.)       Anesthesia Quick Evaluation

## 2015-12-09 ENCOUNTER — Inpatient Hospital Stay (HOSPITAL_COMMUNITY): Payer: Medicare Other | Admitting: Anesthesiology

## 2015-12-09 ENCOUNTER — Inpatient Hospital Stay (HOSPITAL_COMMUNITY): Payer: Medicare Other | Admitting: Emergency Medicine

## 2015-12-09 ENCOUNTER — Inpatient Hospital Stay (HOSPITAL_COMMUNITY): Payer: Medicare Other

## 2015-12-09 ENCOUNTER — Encounter (HOSPITAL_COMMUNITY)
Admission: RE | Disposition: A | Discharge status: Home Health | Payer: Self-pay | Source: Ambulatory Visit | Attending: Orthopedic Surgery

## 2015-12-09 ENCOUNTER — Inpatient Hospital Stay (HOSPITAL_COMMUNITY)
Admission: RE | Admit: 2015-12-09 | Discharge: 2015-12-10 | DRG: 470 | Disposition: A | Discharge status: Home Health | Payer: Medicare Other | Source: Ambulatory Visit | Attending: Orthopedic Surgery | Admitting: Orthopedic Surgery

## 2015-12-09 ENCOUNTER — Encounter (HOSPITAL_COMMUNITY): Payer: Self-pay | Admitting: *Deleted

## 2015-12-09 DIAGNOSIS — I10 Essential (primary) hypertension: Secondary | ICD-10-CM | POA: Diagnosis present

## 2015-12-09 DIAGNOSIS — E041 Nontoxic single thyroid nodule: Secondary | ICD-10-CM | POA: Diagnosis present

## 2015-12-09 DIAGNOSIS — I471 Supraventricular tachycardia: Secondary | ICD-10-CM | POA: Diagnosis present

## 2015-12-09 DIAGNOSIS — M1611 Unilateral primary osteoarthritis, right hip: Secondary | ICD-10-CM | POA: Diagnosis present

## 2015-12-09 DIAGNOSIS — Z85828 Personal history of other malignant neoplasm of skin: Secondary | ICD-10-CM

## 2015-12-09 DIAGNOSIS — Z86010 Personal history of colonic polyps: Secondary | ICD-10-CM | POA: Diagnosis not present

## 2015-12-09 DIAGNOSIS — G4733 Obstructive sleep apnea (adult) (pediatric): Secondary | ICD-10-CM | POA: Diagnosis present

## 2015-12-09 DIAGNOSIS — E785 Hyperlipidemia, unspecified: Secondary | ICD-10-CM | POA: Diagnosis present

## 2015-12-09 DIAGNOSIS — Z419 Encounter for procedure for purposes other than remedying health state, unspecified: Secondary | ICD-10-CM

## 2015-12-09 DIAGNOSIS — K219 Gastro-esophageal reflux disease without esophagitis: Secondary | ICD-10-CM | POA: Diagnosis present

## 2015-12-09 DIAGNOSIS — Z9989 Dependence on other enabling machines and devices: Secondary | ICD-10-CM

## 2015-12-09 DIAGNOSIS — M25551 Pain in right hip: Secondary | ICD-10-CM | POA: Diagnosis present

## 2015-12-09 HISTORY — PX: TOTAL HIP ARTHROPLASTY: SHX124

## 2015-12-09 SURGERY — ARTHROPLASTY, HIP, TOTAL, ANTERIOR APPROACH
Anesthesia: Monitor Anesthesia Care | Site: Hip | Laterality: Right

## 2015-12-09 SURGERY — ARTHROPLASTY, HIP, TOTAL, ANTERIOR APPROACH
Anesthesia: General | Laterality: Right

## 2015-12-09 MED ORDER — METHOCARBAMOL 500 MG PO TABS
500.0000 mg | ORAL_TABLET | Freq: Four times a day (QID) | ORAL | Status: DC | PRN
  Administered 2015-12-09 (×2): 500 mg via ORAL
  Filled 2015-12-09 (×2): qty 1

## 2015-12-09 MED ORDER — CEFAZOLIN SODIUM-DEXTROSE 2-4 GM/100ML-% IV SOLN
2.0000 g | INTRAVENOUS | Status: AC
  Administered 2015-12-09: 2 g via INTRAVENOUS
  Filled 2015-12-09: qty 100

## 2015-12-09 MED ORDER — DOCUSATE SODIUM 100 MG PO CAPS
100.0000 mg | ORAL_CAPSULE | Freq: Two times a day (BID) | ORAL | Status: DC
  Administered 2015-12-09 – 2015-12-10 (×2): 100 mg via ORAL
  Filled 2015-12-09 (×2): qty 1

## 2015-12-09 MED ORDER — MORPHINE SULFATE (PF) 2 MG/ML IV SOLN: 2.0000 mg | INTRAVENOUS | Status: DC | PRN

## 2015-12-09 MED ORDER — BISOPROLOL FUMARATE 10 MG PO TABS
5.0000 mg | ORAL_TABLET | Freq: Every evening | ORAL | Status: DC
  Administered 2015-12-09: 5 mg via ORAL
  Filled 2015-12-09: qty 1

## 2015-12-09 MED ORDER — OXYCODONE-ACETAMINOPHEN 5-325 MG PO TABS: 1.0000 | ORAL_TABLET | ORAL | 0 refills | Status: DC | PRN

## 2015-12-09 MED ORDER — RIVAROXABAN 10 MG PO TABS: 10.0000 mg | ORAL_TABLET | Freq: Every day | ORAL | 0 refills | Status: DC

## 2015-12-09 MED ORDER — METHOCARBAMOL 1000 MG/10ML IJ SOLN
500.0000 mg | Freq: Four times a day (QID) | INTRAVENOUS | Status: DC | PRN
  Filled 2015-12-09: qty 5

## 2015-12-09 MED ORDER — KETOROLAC TROMETHAMINE 30 MG/ML IJ SOLN
INTRAMUSCULAR | Status: AC
  Filled 2015-12-09: qty 1

## 2015-12-09 MED ORDER — MIDAZOLAM HCL 2 MG/2ML IJ SOLN
INTRAMUSCULAR | Status: AC
  Filled 2015-12-09: qty 2

## 2015-12-09 MED ORDER — DEXAMETHASONE SODIUM PHOSPHATE 10 MG/ML IJ SOLN: 10.0000 mg | Freq: Once | INTRAMUSCULAR | Status: DC

## 2015-12-09 MED ORDER — 0.9 % SODIUM CHLORIDE (POUR BTL) OPTIME
TOPICAL | Status: DC | PRN
  Administered 2015-12-09: 1000 mL

## 2015-12-09 MED ORDER — METOCLOPRAMIDE HCL 5 MG/ML IJ SOLN: 5.0000 mg | Freq: Three times a day (TID) | INTRAMUSCULAR | Status: DC | PRN

## 2015-12-09 MED ORDER — LIDOCAINE 2% (20 MG/ML) 5 ML SYRINGE
INTRAMUSCULAR | Status: DC | PRN
  Administered 2015-12-09: 20 mg via INTRAVENOUS

## 2015-12-09 MED ORDER — PROPOFOL 10 MG/ML IV BOLUS
INTRAVENOUS | Status: DC | PRN
  Administered 2015-12-09: 20 mg via INTRAVENOUS

## 2015-12-09 MED ORDER — CELECOXIB 200 MG PO CAPS
200.0000 mg | ORAL_CAPSULE | Freq: Two times a day (BID) | ORAL | Status: DC
  Administered 2015-12-09 – 2015-12-10 (×2): 200 mg via ORAL
  Filled 2015-12-09 (×2): qty 1

## 2015-12-09 MED ORDER — FENTANYL CITRATE (PF) 100 MCG/2ML IJ SOLN
INTRAMUSCULAR | Status: DC | PRN
  Administered 2015-12-09 (×2): 50 ug via INTRAVENOUS

## 2015-12-09 MED ORDER — LOSARTAN POTASSIUM 50 MG PO TABS
100.0000 mg | ORAL_TABLET | Freq: Every day | ORAL | Status: DC
  Administered 2015-12-09: 100 mg via ORAL
  Filled 2015-12-09 (×2): qty 2

## 2015-12-09 MED ORDER — ACETAMINOPHEN 325 MG PO TABS
650.0000 mg | ORAL_TABLET | Freq: Four times a day (QID) | ORAL | Status: AC
  Administered 2015-12-09 – 2015-12-10 (×4): 650 mg via ORAL
  Filled 2015-12-09 (×4): qty 2

## 2015-12-09 MED ORDER — LACTATED RINGERS IV SOLN
INTRAVENOUS | Status: DC
  Administered 2015-12-09 (×2): via INTRAVENOUS

## 2015-12-09 MED ORDER — RIVAROXABAN 10 MG PO TABS
10.0000 mg | ORAL_TABLET | Freq: Every day | ORAL | Status: DC
  Administered 2015-12-10: 10 mg via ORAL
  Filled 2015-12-09: qty 1

## 2015-12-09 MED ORDER — ONDANSETRON HCL 4 MG/2ML IJ SOLN: 4.0000 mg | Freq: Once | INTRAMUSCULAR | Status: DC | PRN

## 2015-12-09 MED ORDER — ONDANSETRON HCL 4 MG PO TABS: 4.0000 mg | ORAL_TABLET | Freq: Three times a day (TID) | ORAL | 0 refills | Status: AC | PRN

## 2015-12-09 MED ORDER — GABAPENTIN 300 MG PO CAPS
300.0000 mg | ORAL_CAPSULE | Freq: Once | ORAL | Status: AC
  Administered 2015-12-09: 300 mg via ORAL
  Filled 2015-12-09: qty 1

## 2015-12-09 MED ORDER — FENTANYL CITRATE (PF) 100 MCG/2ML IJ SOLN
INTRAMUSCULAR | Status: AC
  Filled 2015-12-09: qty 2

## 2015-12-09 MED ORDER — ACETAMINOPHEN 325 MG PO TABS: 650.0000 mg | ORAL_TABLET | Freq: Four times a day (QID) | ORAL | Status: DC | PRN

## 2015-12-09 MED ORDER — KETOROLAC TROMETHAMINE 30 MG/ML IJ SOLN
INTRAMUSCULAR | Status: DC | PRN
  Administered 2015-12-09: 30 mg via INTRAMUSCULAR

## 2015-12-09 MED ORDER — METOCLOPRAMIDE HCL 5 MG PO TABS: 5.0000 mg | ORAL_TABLET | Freq: Three times a day (TID) | ORAL | Status: DC | PRN

## 2015-12-09 MED ORDER — SENNA 8.6 MG PO TABS
1.0000 | ORAL_TABLET | Freq: Two times a day (BID) | ORAL | Status: DC
  Administered 2015-12-09 – 2015-12-10 (×2): 8.6 mg via ORAL
  Filled 2015-12-09 (×2): qty 1

## 2015-12-09 MED ORDER — KETOROLAC TROMETHAMINE 15 MG/ML IJ SOLN
7.5000 mg | Freq: Four times a day (QID) | INTRAMUSCULAR | Status: AC
  Administered 2015-12-09 – 2015-12-10 (×4): 7.5 mg via INTRAVENOUS
  Filled 2015-12-09 (×4): qty 1

## 2015-12-09 MED ORDER — DEXTROSE 5 % IV SOLN
INTRAVENOUS | Status: DC | PRN
  Administered 2015-12-09: 25 ug/min via INTRAVENOUS

## 2015-12-09 MED ORDER — DEXTROSE-NACL 5-0.45 % IV SOLN
INTRAVENOUS | Status: DC
  Administered 2015-12-09: 12:00:00 via INTRAVENOUS

## 2015-12-09 MED ORDER — MIDAZOLAM HCL 5 MG/5ML IJ SOLN
INTRAMUSCULAR | Status: DC | PRN
  Administered 2015-12-09: 2 mg via INTRAVENOUS

## 2015-12-09 MED ORDER — FLEET ENEMA 7-19 GM/118ML RE ENEM: 1.0000 | ENEMA | Freq: Once | RECTAL | Status: DC | PRN

## 2015-12-09 MED ORDER — SORBITOL 70 % SOLN: 30.0000 mL | Freq: Every day | Status: DC | PRN

## 2015-12-09 MED ORDER — BUPIVACAINE-EPINEPHRINE (PF) 0.5% -1:200000 IJ SOLN
INTRAMUSCULAR | Status: AC
  Filled 2015-12-09: qty 30

## 2015-12-09 MED ORDER — DIPHENHYDRAMINE HCL 12.5 MG/5ML PO ELIX: 12.5000 mg | ORAL_SOLUTION | ORAL | Status: DC | PRN

## 2015-12-09 MED ORDER — PROPOFOL 500 MG/50ML IV EMUL
INTRAVENOUS | Status: DC | PRN
  Administered 2015-12-09: 50 ug/kg/min via INTRAVENOUS

## 2015-12-09 MED ORDER — MENTHOL 3 MG MT LOZG: 1.0000 | LOZENGE | OROMUCOSAL | Status: DC | PRN

## 2015-12-09 MED ORDER — ATORVASTATIN CALCIUM 40 MG PO TABS
40.0000 mg | ORAL_TABLET | Freq: Every day | ORAL | Status: DC
  Administered 2015-12-09: 40 mg via ORAL
  Filled 2015-12-09: qty 1

## 2015-12-09 MED ORDER — POLYETHYLENE GLYCOL 3350 17 G PO PACK: 17.0000 g | PACK | Freq: Every day | ORAL | Status: DC | PRN

## 2015-12-09 MED ORDER — BUPIVACAINE IN DEXTROSE 0.75-8.25 % IT SOLN
INTRATHECAL | Status: DC | PRN
  Administered 2015-12-09: 2 mL via INTRATHECAL

## 2015-12-09 MED ORDER — PROPOFOL 10 MG/ML IV BOLUS
INTRAVENOUS | Status: AC
  Filled 2015-12-09: qty 20

## 2015-12-09 MED ORDER — PROPOFOL 1000 MG/100ML IV EMUL
INTRAVENOUS | Status: AC
  Filled 2015-12-09: qty 100

## 2015-12-09 MED ORDER — DOCUSATE SODIUM 100 MG PO CAPS: 100.0000 mg | ORAL_CAPSULE | Freq: Two times a day (BID) | ORAL | 0 refills | Status: AC

## 2015-12-09 MED ORDER — ACETAMINOPHEN 650 MG RE SUPP: 650.0000 mg | Freq: Four times a day (QID) | RECTAL | Status: DC | PRN

## 2015-12-09 MED ORDER — PANTOPRAZOLE SODIUM 40 MG PO TBEC
40.0000 mg | DELAYED_RELEASE_TABLET | Freq: Every day | ORAL | Status: DC
  Administered 2015-12-10: 40 mg via ORAL
  Filled 2015-12-09: qty 1

## 2015-12-09 MED ORDER — OXYCODONE HCL 5 MG PO TABS
5.0000 mg | ORAL_TABLET | ORAL | Status: DC | PRN
  Administered 2015-12-09 (×2): 10 mg via ORAL
  Filled 2015-12-09 (×2): qty 2

## 2015-12-09 MED ORDER — PHENOL 1.4 % MT LIQD: 1.0000 | OROMUCOSAL | Status: DC | PRN

## 2015-12-09 MED ORDER — ONDANSETRON HCL 4 MG PO TABS: 4.0000 mg | ORAL_TABLET | Freq: Four times a day (QID) | ORAL | Status: DC | PRN

## 2015-12-09 MED ORDER — METHOCARBAMOL 500 MG PO TABS: 500.0000 mg | ORAL_TABLET | Freq: Four times a day (QID) | ORAL | 0 refills | Status: DC | PRN

## 2015-12-09 MED ORDER — HYDROCHLOROTHIAZIDE 12.5 MG PO CAPS
12.5000 mg | ORAL_CAPSULE | Freq: Every day | ORAL | Status: DC
  Administered 2015-12-09 – 2015-12-10 (×2): 12.5 mg via ORAL
  Filled 2015-12-09 (×2): qty 1

## 2015-12-09 MED ORDER — CEFAZOLIN SODIUM-DEXTROSE 2-4 GM/100ML-% IV SOLN
2.0000 g | Freq: Four times a day (QID) | INTRAVENOUS | Status: AC
  Administered 2015-12-09 (×2): 2 g via INTRAVENOUS
  Filled 2015-12-09 (×2): qty 100

## 2015-12-09 MED ORDER — LIDOCAINE 2% (20 MG/ML) 5 ML SYRINGE
INTRAMUSCULAR | Status: AC
  Filled 2015-12-09: qty 5

## 2015-12-09 MED ORDER — ACETAMINOPHEN 500 MG PO TABS
1000.0000 mg | ORAL_TABLET | Freq: Once | ORAL | Status: AC
  Administered 2015-12-09: 1000 mg via ORAL
  Filled 2015-12-09: qty 2

## 2015-12-09 MED ORDER — BUPIVACAINE-EPINEPHRINE 0.5% -1:200000 IJ SOLN
INTRAMUSCULAR | Status: DC | PRN
  Administered 2015-12-09: 50 mL

## 2015-12-09 MED ORDER — LOSARTAN POTASSIUM-HCTZ 100-12.5 MG PO TABS: 1.0000 | ORAL_TABLET | Freq: Every day | ORAL | Status: DC

## 2015-12-09 MED ORDER — SODIUM CHLORIDE FLUSH 0.9 % IV SOLN
INTRAVENOUS | Status: DC | PRN
  Administered 2015-12-09: 30 mL

## 2015-12-09 MED ORDER — ONDANSETRON HCL 4 MG/2ML IJ SOLN: 4.0000 mg | Freq: Four times a day (QID) | INTRAMUSCULAR | Status: DC | PRN

## 2015-12-09 MED ORDER — FENTANYL CITRATE (PF) 100 MCG/2ML IJ SOLN
25.0000 ug | INTRAMUSCULAR | Status: DC | PRN
  Administered 2015-12-09: 25 ug via INTRAVENOUS

## 2015-12-09 SURGICAL SUPPLY — 56 items
BAG DECANTER FOR FLEXI CONT (MISCELLANEOUS) IMPLANT
BENZOIN TINCTURE PRP APPL 2/3 (GAUZE/BANDAGES/DRESSINGS) ×3 IMPLANT
BLADE SAG 18X100X1.27 (BLADE) IMPLANT
BLADE SAW SGTL 18X1.27X75 (BLADE) ×2 IMPLANT
BLADE SAW SGTL 18X1.27X75MM (BLADE) ×1
CAPT HIP TOTAL 2 ×3 IMPLANT
CLOSURE STERI-STRIP 1/2X4 (GAUZE/BANDAGES/DRESSINGS) ×1
CLSR STERI-STRIP ANTIMIC 1/2X4 (GAUZE/BANDAGES/DRESSINGS) ×2 IMPLANT
COVER PERINEAL POST (MISCELLANEOUS) ×3 IMPLANT
COVER SURGICAL LIGHT HANDLE (MISCELLANEOUS) ×3 IMPLANT
DRAPE C-ARM 42X72 X-RAY (DRAPES) ×3 IMPLANT
DRAPE STERI IOBAN 125X83 (DRAPES) ×3 IMPLANT
DRAPE U-SHAPE 47X51 STRL (DRAPES) ×6 IMPLANT
DRSG MEPILEX BORDER 4X8 (GAUZE/BANDAGES/DRESSINGS) ×3 IMPLANT
DURAPREP 26ML APPLICATOR (WOUND CARE) ×3 IMPLANT
ELECT BLADE 4.0 EZ CLEAN MEGAD (MISCELLANEOUS) ×6
ELECT REM PT RETURN 9FT ADLT (ELECTROSURGICAL) ×3
ELECTRODE BLDE 4.0 EZ CLN MEGD (MISCELLANEOUS) ×2 IMPLANT
ELECTRODE REM PT RTRN 9FT ADLT (ELECTROSURGICAL) ×1 IMPLANT
FACESHIELD WRAPAROUND (MASK) ×6 IMPLANT
GLOVE BIO SURGEON STRL SZ 6.5 (GLOVE) ×2 IMPLANT
GLOVE BIO SURGEON STRL SZ7.5 (GLOVE) ×9 IMPLANT
GLOVE BIO SURGEONS STRL SZ 6.5 (GLOVE) ×1
GLOVE BIOGEL PI IND STRL 8 (GLOVE) ×2 IMPLANT
GLOVE BIOGEL PI IND STRL 8.5 (GLOVE) ×1 IMPLANT
GLOVE BIOGEL PI INDICATOR 8 (GLOVE) ×4
GLOVE BIOGEL PI INDICATOR 8.5 (GLOVE) ×2
GOWN STRL REUS W/ TWL LRG LVL3 (GOWN DISPOSABLE) ×3 IMPLANT
GOWN STRL REUS W/ TWL XL LVL3 (GOWN DISPOSABLE) ×1 IMPLANT
GOWN STRL REUS W/TWL LRG LVL3 (GOWN DISPOSABLE) ×6
GOWN STRL REUS W/TWL XL LVL3 (GOWN DISPOSABLE) ×2
KIT BASIN OR (CUSTOM PROCEDURE TRAY) ×3 IMPLANT
KIT ROOM TURNOVER OR (KITS) ×3 IMPLANT
MANIFOLD NEPTUNE II (INSTRUMENTS) ×3 IMPLANT
NDL SAFETY ECLIPSE 18X1.5 (NEEDLE) ×1 IMPLANT
NEEDLE HYPO 18GX1.5 SHARP (NEEDLE) ×2
NEEDLE HYPO 22GX1.5 SAFETY (NEEDLE) ×3 IMPLANT
NS IRRIG 1000ML POUR BTL (IV SOLUTION) ×3 IMPLANT
PACK TOTAL JOINT (CUSTOM PROCEDURE TRAY) ×3 IMPLANT
PACK UNIVERSAL I (CUSTOM PROCEDURE TRAY) ×3 IMPLANT
PAD ARMBOARD 7.5X6 YLW CONV (MISCELLANEOUS) ×3 IMPLANT
SPONGE LAP 18X18 X RAY DECT (DISPOSABLE) IMPLANT
SUT MNCRL AB 4-0 PS2 18 (SUTURE) ×6 IMPLANT
SUT MON AB 2-0 CT1 36 (SUTURE) ×3 IMPLANT
SUT VIC AB 0 CT1 27 (SUTURE) ×2
SUT VIC AB 0 CT1 27XBRD ANBCTR (SUTURE) ×1 IMPLANT
SUT VIC AB 1 CT1 27 (SUTURE) ×2
SUT VIC AB 1 CT1 27XBRD ANBCTR (SUTURE) ×1 IMPLANT
SYR 50ML LL SCALE MARK (SYRINGE) ×3 IMPLANT
SYRINGE 20CC LL (MISCELLANEOUS) IMPLANT
TOWEL OR 17X24 6PK STRL BLUE (TOWEL DISPOSABLE) IMPLANT
TOWEL OR 17X26 10 PK STRL BLUE (TOWEL DISPOSABLE) ×3 IMPLANT
TRAY CATH 16FR W/PLASTIC CATH (SET/KITS/TRAYS/PACK) ×3 IMPLANT
TRAY FOLEY CATH 16FRSI W/METER (SET/KITS/TRAYS/PACK) IMPLANT
WATER STERILE IRR 1000ML POUR (IV SOLUTION) IMPLANT
YANKAUER SUCT BULB TIP NO VENT (SUCTIONS) IMPLANT

## 2015-12-09 NOTE — Anesthesia Procedure Notes (Signed)
Procedure Name: MAC Date/Time: 12/09/2015 7:45 AM Performed by: Everlean Cherry A Pre-anesthesia Checklist: Patient identified, Emergency Drugs available, Suction available and Patient being monitored Patient Re-evaluated:Patient Re-evaluated prior to inductionOxygen Delivery Method: Nasal cannula

## 2015-12-09 NOTE — Discharge Instructions (Signed)

## 2015-12-09 NOTE — Op Note (Signed)
12/09/2015  9:02 AM  PATIENT:  Stephanie Chavez   MRN: [id number]  PRE-OPERATIVE DIAGNOSIS:  OA RIGHT HIP  POST-OPERATIVE DIAGNOSIS:  OA RIGHT HIP  PROCEDURE:  Procedure(s): RIGHT TOTAL HIP ARTHROPLASTY ANTERIOR APPROACH  PREOPERATIVE INDICATIONS:    Stephanie Chavez is an 70 y.o. female who has a diagnosis of Primary osteoarthritis of right hip and elected for surgical management after failing conservative treatment.  The risks benefits and alternatives were discussed with the patient including but not limited to the risks of nonoperative treatment, versus surgical intervention including infection, bleeding, nerve injury, periprosthetic fracture, the need for revision surgery, dislocation, leg length discrepancy, blood clots, cardiopulmonary complications, morbidity, mortality, among others, and they were willing to proceed.     OPERATIVE REPORT     SURGEON:   Stephanie Chavez, Stephanie Amble, MD    ASSISTANT:  Stephanie Hockey, PA-C, he was present and scrubbed throughout the case, critical for completion in a timely fashion, and for retraction, instrumentation, and closure.     ANESTHESIA:  General    COMPLICATIONS:  None.     COMPONENTS:  Stryker acolade fit femur size 3 with a 36 mm -5 head ball and a PSL acetabular shell size 48 with a  polyethylene liner    PROCEDURE IN DETAIL:   The patient was met in the holding area and  identified.  The appropriate hip was identified and marked at the operative site.  The patient was then transported to the OR  and  placed under anesthesia per that record.  At that point, the patient was  placed in the supine position and  secured to the operating room table and all bony prominences padded. He received pre-operative antibiotics    The operative lower extremity was prepped from the iliac crest to the distal leg.  Sterile draping was performed.  Time out was performed prior to incision.      Skin incision was made just 2 cm lateral to the ASIS  extending in  line with the tensor fascia lata. Electrocautery was used to control all bleeders. I dissected down sharply to the fascia of the tensor fascia lata was confirmed that the muscle fibers beneath were running posteriorly. I then incised the fascia over the superficial tensor fascia lata in line with the incision. The fascia was elevated off the anterior aspect of the muscle the muscle was retracted posteriorly and protected throughout the case. I then used electrocautery to incise the tensor fascia lata fascia control and all bleeders. Immediately visible was the fat over top of the anterior neck and capsule.  I removed the anterior fat from the capsule and elevated the rectus muscle off of the anterior capsule. I then removed a large time of capsule. The retractors were then placed over the anterior acetabulum as well as around the superior and inferior neck.  I then removed a section of the femoral neck and a napkin ring fashion. Then used the power course to remove the femoral head from the acetabulum and thoroughly irrigated the acetabulum. I sized the femoral head.    I then exposed the deep acetabulum, cleared out any tissue including the ligamentum teres.   After adequate visualization, I excised the labrum, and then sequentially reamed.  I then impacted the acetabular implant into place using fluoroscopy for guidance.  Appropriate version and inclination was confirmed clinically matching their bony anatomy, and with fluoroscopy.  I placed a 71m screw in the posterior/superio position with an excellent bite.  I then placed the polyethylene liner in place  I then adducted the leg and released the external rotators from the posterior femur allowing it to be easily delivered up lateral and anterior to the acetabulum for preparation of the femoral canal.    I then prepared the proximal femur using the cookie-cutter and then sequentially reamed and broached.  A trial broach, neck, and head was  utilized, and I reduced the hip and used floroscopy to assess the neck length and femoral implant.  I then impacted the femoral prosthesis into place into the appropriate version. The hip was then reduced and fluoroscopy confirmed appropriate position. Leg lengths were restored.  I then irrigated the hip copiously again with, and repaired the fascia with Vicryl, followed by monocryl for the subcutaneous tissue, Monocryl for the skin, Steri-Strips and sterile gauze. The patient was then awakened and returned to PACU in stable and satisfactory condition. There were no complications.  POST OPERATIVE PLAN: WBAT, DVT px: SCD's/TED and xarelto  Stephanie Lynch, MD Orthopedic Surgeon [phone]

## 2015-12-09 NOTE — Transfer of Care (Signed)
Immediate Anesthesia Transfer of Care Note  Patient: Stephanie Chavez  Procedure(s) Performed: Procedure(s): RIGHT TOTAL HIP ARTHROPLASTY ANTERIOR APPROACH (Right)  Patient Location: PACU  Anesthesia Type:MAC  Level of Consciousness: awake, alert , oriented and patient cooperative  Airway & Oxygen Therapy: Patient Spontanous Breathing and Patient connected to nasal cannula oxygen  Post-op Assessment: Report given to RN and Post -op Vital signs reviewed and stable  Post vital signs: Reviewed and stable  Last Vitals:  Vitals:   12/09/15 0606  BP: (!) 163/56  Pulse: (!) 57  Resp: 20  Temp: 36.8 C    Last Pain:  Vitals:   12/09/15 0606  TempSrc: Oral         Complications: No apparent anesthesia complications

## 2015-12-09 NOTE — Interval H&P Note (Signed)
History and Physical Interval Note:  12/09/2015 7:15 AM  Stephanie Chavez  has presented today for surgery, with the diagnosis of OA RIGHT HIP  The various methods of treatment have been discussed with the patient and family. After consideration of risks, benefits and other options for treatment, the patient has consented to  Procedure(s): RIGHT TOTAL HIP ARTHROPLASTY ANTERIOR APPROACH (Right) as a surgical intervention .  The patient's history has been reviewed, patient examined, no change in status, stable for surgery.  I have reviewed the patient's chart and labs.  Questions were answered to the patient's satisfaction.     MURPHY, TIMOTHY D

## 2015-12-09 NOTE — Anesthesia Procedure Notes (Signed)
Spinal  Patient location during procedure: OR Staffing Anesthesiologist: Catalina Gravel Performed: anesthesiologist  Preanesthetic Checklist Completed: patient identified, surgical consent, pre-op evaluation, timeout performed, IV checked, risks and benefits discussed and monitors and equipment checked Spinal Block Patient position: sitting Prep: site prepped and draped and DuraPrep Patient monitoring: continuous pulse ox and blood pressure Approach: midline Location: L3-4 Injection technique: single-shot Needle Needle type: Pencan  Needle gauge: 25 G Assessment Sensory level: T8 Additional Notes Functioning IV was confirmed and monitors were applied. Sterile prep and drape, including hand hygiene, mask and sterile gloves were used. The patient was positioned and the spine was prepped. The skin was anesthetized with lidocaine.  Free flow of clear CSF was obtained prior to injecting local anesthetic into the CSF.  The spinal needle aspirated freely following injection.  The needle was carefully withdrawn.  The patient tolerated the procedure well. Consent was obtained prior to procedure with all questions answered and concerns addressed. Risks including but not limited to bleeding, infection, nerve damage, paralysis, failed block, inadequate analgesia, allergic reaction, high spinal, itching and headache were discussed and the patient wished to proceed.   Hoy Morn, MD

## 2015-12-09 NOTE — Evaluation (Signed)
Physical Therapy Evaluation Patient Details Name: Stephanie Chavez MRN: [id number] DOB: 10/20/1945 Today's Date: 12/09/2015   History of Present Illness  Patient is a 70 y/o female with hx of HTN, HLD, A flutter presents s/p Rt THA, direct anterior approach.  Clinical Impression  Patient presents with post surgical deficits s/p above surgery impacting mobility. Tolerated transfers and gait training with Min guard-supervision. Tolerated standing activities at sink with Min guard assist. Pt has support at home from significant other. Plan for stair training tomorrow as tolerated. Pt active PTA and loves to travel. Will follow acutely to maximize independence and mobility prior to return home.    Follow Up Recommendations Home health PT;Supervision - Intermittent    Equipment Recommendations  None recommended by PT    Recommendations for Other Services       Precautions / Restrictions Precautions Precautions: Fall Restrictions Weight Bearing Restrictions: No      Mobility  Bed Mobility Overal bed mobility: Needs Assistance Bed Mobility: Supine to Sit     Supine to sit: Supervision;HOB elevated     General bed mobility comments: No assist needed. Use of rail for support.   Transfers Overall transfer level: Needs assistance Equipment used: Rolling walker (2 wheeled) Transfers: Sit to/from Stand Sit to Stand: Min guard         General transfer comment: Min guard for safety. Cues for hand placement/technique.  Ambulation/Gait Ambulation/Gait assistance: Min guard Ambulation Distance (Feet): 150 Feet Assistive device: Rolling walker (2 wheeled) Gait Pattern/deviations: Step-through pattern;Decreased stride length;Step-to pattern;Narrow base of support Gait velocity: decreased   General Gait Details: Slow, mostly steady gait with cues for decreased WB through UEs; no knee instability.   Stairs            Wheelchair Mobility    Modified Rankin (Stroke Patients  Only)       Balance Overall balance assessment: Needs assistance Sitting-balance support: Feet supported;No upper extremity supported Sitting balance-Leahy Scale: Good     Standing balance support: During functional activity Standing balance-Leahy Scale: Fair Standing balance comment: Able to stand at sink and manipulate bags- brush teeth, donn deoderant without assist with no UEs upport.                             Pertinent Vitals/Pain Pain Assessment: No/denies pain    Home Living Family/patient expects to be discharged to:: Private residence Living Arrangements: Spouse/significant other Available Help at Discharge: Family;Available 24 hours/day Type of Home: House Home Access: Level entry     Home Layout: One level (1.5 levels) Home Equipment: Cane - single point;Walker - 2 wheels      Prior Function Level of Independence: Independent with assistive device(s)         Comments: Uses [facility] PTA. Loves to travel.     Hand Dominance        Extremity/Trunk Assessment   Upper Extremity Assessment: Defer to OT evaluation           Lower Extremity Assessment: RLE deficits/detail RLE Deficits / Details: Limited AROM/strength secondary to post op/pain.       Communication   Communication: No difficulties  Cognition Arousal/Alertness: Awake/alert Behavior During Therapy: WFL for tasks assessed/performed Overall Cognitive Status: Within Functional Limits for tasks assessed                      General Comments      Exercises Total Joint Exercises Ankle Circles/Pumps:  Both;10 reps;Supine Quad Sets: Both;10 reps;Supine   Assessment/Plan    PT Assessment Patient needs continued PT services  PT Problem List Decreased strength;Decreased mobility;Decreased balance;Impaired sensation;Decreased range of motion          PT Treatment Interventions Therapeutic activities;Therapeutic exercise;Gait training;Patient/family education;Balance  training;Stair training;Functional mobility training    PT Goals (Current goals can be found in the Care Plan section)  Acute Rehab PT Goals Patient Stated Goal: to go home tomorrow and to travel to [place] next spring PT Goal Formulation: With patient Time For Goal Achievement: 12/23/15 Potential to Achieve Goals: Good    Frequency 7X/week   Barriers to discharge        Co-evaluation               End of Session Equipment Utilized During Treatment: Gait belt Activity Tolerance: Patient tolerated treatment well Patient left: in chair;with call bell/phone within reach Nurse Communication: Mobility status         Time: 1331-1356 PT Time Calculation (min) (ACUTE ONLY): 25 min   Charges:   PT Evaluation $PT Eval Low Complexity: 1 Procedure PT Treatments $Gait Training: 8-22 mins   PT G Codes:        [name] A Luc Shammas 12/09/2015, 2:38 PM Wray Kearns, [place], DPT [phone]

## 2015-12-10 ENCOUNTER — Encounter (HOSPITAL_COMMUNITY): Payer: Self-pay | Admitting: Orthopedic Surgery

## 2015-12-10 LAB — CBC
HCT: 29.8 % — ABNORMAL LOW (ref 36.0–46.0)
Hemoglobin: 9.9 g/dL — ABNORMAL LOW (ref 12.0–15.0)
MCH: 32 pg (ref 26.0–34.0)
MCHC: 33.2 g/dL (ref 30.0–36.0)
MCV: 96.4 fL (ref 78.0–100.0)
PLATELETS: 187 10*3/uL (ref 150–400)
RBC: 3.09 MIL/uL — AB (ref 3.87–5.11)
RDW: 13.3 % (ref 11.5–15.5)
WBC: 6.9 10*3/uL (ref 4.0–10.5)

## 2015-12-10 NOTE — Progress Notes (Signed)
Physical Therapy Treatment Patient Details Name: Stephanie Chavez MRN: [id number] DOB: 1945-09-01 Today's Date: 12/10/2015    History of Present Illness Patient is a 69 y/o female with hx of HTN, HLD, A flutter presents s/p Rt THA, direct anterior approach.    PT Comments    Pt performed increased gait, reviewed HEP, and reviewed stair training in prep for d/c home.   PTA issued HEP.  Informed nurse that patient is ready to d/c.    Follow Up Recommendations  Home health PT;Supervision - Intermittent     Equipment Recommendations  None recommended by PT    Recommendations for Other Services       Precautions / Restrictions Precautions Precautions: Fall Restrictions Weight Bearing Restrictions: Yes RLE Weight Bearing: Weight bearing as tolerated    Mobility  Bed Mobility               General bed mobility comments: Pt sitting OOB in recliner when PT arrived  Transfers Overall transfer level: Modified independent Equipment used: Rolling walker (2 wheeled) Transfers: Sit to/from Stand Sit to Stand: Modified independent (Device/Increase time)         General transfer comment: Good technique no assist needed.    Ambulation/Gait Ambulation/Gait assistance: Supervision Ambulation Distance (Feet): 450 Feet Assistive device: Rolling walker (2 wheeled) Gait Pattern/deviations: Step-through pattern;Trunk flexed Gait velocity: decreased   General Gait Details: Cues for upper trunk control and neutral postion of R hip.  Pt presents with IR likley due to weakness.     Stairs Stairs: Yes Stairs assistance: Supervision Stair Management: No rails;Two rails;Step to pattern;Forwards;With walker Number of Stairs: 3 (x2 stairs with B rails, x1 stair with RW to simulate threshold negotiation.  ) General stair comments: Cues for sequencing and RW placement.  Cues for hand placement.    Wheelchair Mobility    Modified Rankin (Stroke Patients Only)       Balance  Overall balance assessment: Needs assistance Sitting-balance support: No upper extremity supported;Feet supported Sitting balance-Leahy Scale: Good     Standing balance support: No upper extremity supported;During functional activity Standing balance-Leahy Scale: Fair Standing balance comment: able to stand at sink and lean/reach into bag on the side to retreive items for ADL with no LOB                    Cognition Arousal/Alertness: Awake/alert Behavior During Therapy: WFL for tasks assessed/performed Overall Cognitive Status: Within Functional Limits for tasks assessed                      Exercises Total Joint Exercises Ankle Circles/Pumps: AROM;Both;10 reps;Supine Quad Sets: AROM;10 reps;Supine;Right Short Arc Quad: AROM;Right;10 reps;Supine Heel Slides: AROM;Right;10 reps;Supine Hip ABduction/ADduction: AROM;Right;20 reps;Supine;Standing (1x10 supine and 1x10 standing) Long Arc Quad: AROM;Right;10 reps;Supine Knee Flexion: AROM;Right;10 reps;Standing Marching in Standing: AROM;Right;10 reps;Standing Standing Hip Extension: AROM;Right;10 reps;Standing    General Comments        Pertinent Vitals/Pain Pain Assessment: No/denies pain (reports soreness and heaviness but does not describe as pain reports 0/10)    Home Living Family/patient expects to be discharged to:: Private residence Living Arrangements: Spouse/significant other Available Help at Discharge: Family;Available 24 hours/day Type of Home: House Home Access: Level entry   Home Layout: Other (Comment) (1.5 levels) Home Equipment: Cane - single point;Walker - 2 wheels;Bedside commode;Hand held shower head;Adaptive equipment;Toilet riser Medical illustrator) Additional Comments: Pt is a retired and loves to travel    Prior Function Level of Independence: Independent with assistive  device(s)      Comments: Uses SPC PTA. Loves to travel.   PT Goals (current goals can now be found in the care  plan section) Acute Rehab PT Goals Patient Stated Goal: To get back to the [employer] Potential to Achieve Goals: Good Progress towards PT goals: Progressing toward goals    Frequency    7X/week      PT Plan Current plan remains appropriate    Co-evaluation             End of Session Equipment Utilized During Treatment: Gait belt Activity Tolerance: Patient tolerated treatment well Patient left: in chair;with call bell/phone within reach     Time: 0941-0959 PT Time Calculation (min) (ACUTE ONLY): 18 min  Charges:  $Gait Training: 8-22 mins                    G Codes:      Stephanie Chavez 2016/01/05, 10:05 AM  Stephanie Chavez, PTA pager [phone]

## 2015-12-10 NOTE — Anesthesia Postprocedure Evaluation (Signed)
Anesthesia Post Note  Patient: Stephanie Chavez  Procedure(s) Performed: Procedure(s) (LRB): RIGHT TOTAL HIP ARTHROPLASTY ANTERIOR APPROACH (Right)  Patient location during evaluation: PACU Anesthesia Type: Spinal Level of consciousness: oriented and awake and alert Pain management: pain level controlled Vital Signs Assessment: post-procedure vital signs reviewed and stable Respiratory status: spontaneous breathing, respiratory function stable and patient connected to nasal cannula oxygen Cardiovascular status: blood pressure returned to baseline and stable Postop Assessment: no headache and no backache Anesthetic complications: no    Last Vitals:  Vitals:   12/10/15 0416 12/10/15 1107  BP: (!) 101/56 (!) 108/45  Pulse: 61 60  Resp: 16   Temp: 36.4 C     Last Pain:  Vitals:   12/10/15 0812  TempSrc:   PainSc: 0-No pain                 Catalina Gravel

## 2015-12-10 NOTE — Evaluation (Signed)
Occupational Therapy Evaluation Patient Details Name: Stephanie Chavez MRN: [id number] DOB: [birth year] Today's Date: 12/10/2015    History of Present Illness Patient is a 70 y/o female with hx of HTN, HLD, A flutter presents s/p Rt THA, direct anterior approach.   Clinical Impression   PTA Pt independent in ADL and mod I for mobility with SPC. Pt currently min A for LB ADL, and supervision for ambulation with RW. Pt assessed and received all education and has no further OT needs at this time. Pt is prepared for d/c home with significant other.    Follow Up Recommendations  No OT follow up;Supervision - Intermittent    Equipment Recommendations  None recommended by OT    Recommendations for Other Services       Precautions / Restrictions Precautions Precautions: Fall Restrictions Weight Bearing Restrictions: Yes RLE Weight Bearing: Weight bearing as tolerated      Mobility Bed Mobility               General bed mobility comments: Pt sitting OOB in recliner when OT arrived  Transfers Overall transfer level: Needs assistance Equipment used: Rolling walker (2 wheeled) Transfers: Sit to/from Stand Sit to Stand: Supervision         General transfer comment: good hand placement    Balance Overall balance assessment: Needs assistance Sitting-balance support: No upper extremity supported;Feet supported Sitting balance-Leahy Scale: Good     Standing balance support: No upper extremity supported;During functional activity Standing balance-Leahy Scale: Fair Standing balance comment: able to stand at sink and lean/reach into bag on the side to retreive items for ADL with no LOB                            ADL Overall ADL's : Needs assistance/impaired Eating/Feeding: Modified independent;Sitting   Grooming: Wash/dry hands;Wash/dry face;Oral care;Brushing hair;Modified independent;Standing Grooming Details (indicate cue type and reason): sink level  managing bags (finding items in bags) Upper Body Bathing: Modified independent;Standing   Lower Body Bathing: Minimal assistance;Sit to/from stand Lower Body Bathing Details (indicate cue type and reason): educated on long handle sponge Upper Body Dressing : Modified independent;Sitting Upper Body Dressing Details (indicate cue type and reason): donned bra and shirt Lower Body Dressing: Minimal assistance;Sit to/from stand Lower Body Dressing Details (indicate cue type and reason): min assist for underwear and pants over feet, Pt shared that her significant other will be assisting her at home, and they have grabber/reacher from his back surgeries Toilet Transfer: Modified Independent;Ambulation;RW   Toileting- Clothing Manipulation and Hygiene: Modified independent;Sit to/from stand   Tub/ Shower Transfer: Walk-in shower;Supervision/safety;Ambulation;Rolling walker Tub/Shower Transfer Details (indicate cue type and reason): simulated and discussed shower safety, having caregiver present at first Functional mobility during ADLs: Rolling walker;Supervision/safety       Vision     Perception     Praxis      Pertinent Vitals/Pain       Hand Dominance Right   Extremity/Trunk Assessment Upper Extremity Assessment Upper Extremity Assessment: Overall WFL for tasks assessed   Lower Extremity Assessment Lower Extremity Assessment: Defer to PT evaluation;RLE deficits/detail   Cervical / Trunk Assessment Cervical / Trunk Assessment: Normal   Communication Communication Communication: No difficulties   Cognition Arousal/Alertness: Awake/alert Behavior During Therapy: WFL for tasks assessed/performed Overall Cognitive Status: Within Functional Limits for tasks assessed  General Comments       Exercises       Shoulder Instructions      Home Living Family/patient expects to be discharged to:: Private residence Living Arrangements:  Spouse/significant other Available Help at Discharge: Family;Available 24 hours/day Type of Home: House Home Access: Level entry     Home Layout: Other (Comment) (1.5 levels)     Bathroom Shower/Tub: Occupational psychologist: Standard Bathroom Accessibility: Yes How Accessible: Accessible via walker Home Equipment: [place] - single point;Walker - 2 wheels;Bedside commode;Hand held shower head;Adaptive equipment;Toilet riser Medical illustrator) [employer] Equipment: Reacher Additional Comments: Pt is a retired and loves to travel      Prior Functioning/Environment Level of Independence: Independent with assistive device(s)        Comments: Uses SPC PTA. Loves to travel.        OT Problem List: Decreased range of motion;Decreased activity tolerance;Impaired balance (sitting and/or standing);Pain   OT Treatment/Interventions:      OT Goals(Current goals can be found in the care plan section) Acute Rehab OT Goals Patient Stated Goal: To get back to the [facility] OT Goal Formulation: With patient Time For Goal Achievement: 12/16/15 Potential to Achieve Goals: Good  OT Frequency:     Barriers to D/C:            Co-evaluation              End of Session Equipment Utilized During Treatment: Rolling walker Nurse Communication: Mobility status  Activity Tolerance: Patient tolerated treatment well Patient left: in chair;with call bell/phone within reach   Time: 0810-0836 OT Time Calculation (min): 26 min Charges:  OT General Charges $OT Visit: 1 Procedure OT Evaluation $OT Eval Low Complexity: 1 Procedure OT Treatments $Self Care/Home Management : 8-22 mins G-Codes:    Merri Ray Delbert Darley 12-14-15, 9:11 AM  Hulda Humphrey OTR/L [phone]

## 2015-12-10 NOTE — Care Management Note (Signed)
Case Management Note  Patient Details  Name: Stephanie Chavez MRN: [id number] Date of Birth: 1945-03-06  Subjective/Objective:   S/p right THA                 Action/Plan: Discharge Planning: AVS reviewed: NCM spoke to pt. HH preoperative arranged with [facility]. Pt agreeable to [facility] for HH. Pt has RW and bedside commode at home. Husband, John at home to assist with care.   PCP- Lavera Guise MD  Expected Discharge Date:  12/10/2015              Expected Discharge Plan:  [place]  In-House Referral:  NA  Discharge planning Services  CM Consult  Post Acute Care Choice:  Home Health Choice offered to:  Patient  DME Arranged:  N/A DME Agency:  NA  HH Arranged:  PT [place] Agency:  [place]  Status of Service:  Completed, signed off  If discussed at [place] of Stay Meetings, dates discussed:    Additional Comments:  Erenest Rasher, RN 12/10/2015, 10:48 AM

## 2015-12-10 NOTE — Discharge Summary (Signed)
Discharge Summary  Patient ID: Stephanie Chavez MRN: [id number] DOB/AGE: 71-Sep-1947 71 y.o.  Admit date: 12/09/2015 Discharge date: 12/10/2015  Admission Diagnoses:  Primary osteoarthritis of right hip  Discharge Diagnoses:  Principal Problem:   Primary osteoarthritis of right hip Active Problems:   Paroxysmal supraventricular tachycardia (HCC)   Essential hypertension   OSA on CPAP   Hyperlipidemia   Past Medical History:  Diagnosis Date  . Arthritis   . Atrial flutter ([place])   . Chronic back pain   . Environmental allergies   . GERD (gastroesophageal reflux disease)    takes Protonix daily  . History of colon polyps    benign  . Hyperlipidemia    takes Lipitor daily  . Hypertension    takes Bisoprolol and Hyzaar daily  . Joint pain   . Skin cancer   . Sleep apnea   . Thyroid nodule   . Weakness    numbness-hands     Surgeries: Procedure(s): RIGHT TOTAL HIP ARTHROPLASTY ANTERIOR APPROACH on 12/09/2015   Consultants (if any):   Discharged Condition: Improved  Progress  Subjective: Feeling good.  Ready to go home today.  OOB several times walking in hall.  Pain controlled with PO meds.  Tolerating diet.  Urinating.  No CP, SOB.  Objective: General: NAD.  Upright in chair Resp: No increased WOB Cardio: regular rate and rhythm ABD soft Neurologically intact MSK Neurovascularly intact Sensation intact distally Dorsiflexion/Plantar flexion intact Incision: dressing C/D/I  Plan: Weight Bearing: Weight Bearing as Tolerated (WBAT) right leg Dressings: Mepilex.  VTE prophylaxis: Xarelto, ambulation, SCDs Dispo: Home today  Hospital Course: Stephanie Chavez is an 70 y.o. female who was admitted 12/09/2015 with a diagnosis of Primary osteoarthritis of right hip and went to the operating room on 12/09/2015 and underwent the above named procedures.    She was given perioperative antibiotics:  Anti-infectives    Start     Dose/Rate Route Frequency Ordered Stop    12/09/15 1300  ceFAZolin (ANCEF) IVPB 2g/100 mL premix     2 g 200 mL/hr over 30 Minutes Intravenous Every 6 hours 12/09/15 1118 12/09/15 1821   12/09/15 0628  ceFAZolin (ANCEF) IVPB 2g/100 mL premix     2 g 200 mL/hr over 30 Minutes Intravenous On call to O.R. 12/09/15 0628 12/09/15 0800   12/09/15 0600  ceFAZolin (ANCEF) 3 g in dextrose 5 % 50 mL IVPB  Status:  Discontinued     3 g 130 mL/hr over 30 Minutes Intravenous On call to O.R. 12/08/15 1410 12/09/15 1104    .  She was given sequential compression devices, early ambulation, and Xarelto for DVT prophylaxis.  She benefited maximally from the hospital stay and there were no complications.    Recent vital signs:  Vitals:   12/10/15 0025 12/10/15 0416  BP: (!) 108/56 (!) 101/56  Pulse: (!) 54 61  Resp: 16 16  Temp: 97.9 F (36.6 C) 97.5 F (36.4 C)    Recent laboratory studies:  Lab Results  Component Value Date   HGB 11.5 (L) 11/28/2015   Lab Results  Component Value Date   WBC 5.8 11/28/2015   PLT 239 11/28/2015   Lab Results  Component Value Date   INR 0.91 11/28/2015   Lab Results  Component Value Date   NA 141 11/28/2015   K 3.7 11/28/2015   CL 105 11/28/2015   CO2 26 11/28/2015   BUN 16 11/28/2015   CREATININE 0.90 11/28/2015   GLUCOSE 114 (H) 11/28/2015  Discharge Medications:     Medication List    TAKE these medications   acetaminophen 650 MG CR tablet Commonly known as:  TYLENOL Take 1,300 mg by mouth every 12 (twelve) hours as needed for pain.   atorvastatin 40 MG tablet Commonly known as:  LIPITOR Take 40 mg by mouth daily at 6 PM.   bisoprolol 5 MG tablet Commonly known as:  ZEBETA Take 5 mg by mouth every evening.   docusate sodium 100 MG capsule Commonly known as:  COLACE Take 1 capsule (100 mg total) by mouth 2 (two) times daily. To prevent constipation while taking pain medication.   losartan-hydrochlorothiazide 100-12.5 MG tablet Commonly known as:  HYZAAR Take 1  tablet by mouth daily.   methocarbamol 500 MG tablet Commonly known as:  ROBAXIN Take 1 tablet (500 mg total) by mouth every 6 (six) hours as needed for muscle spasms.   ondansetron 4 MG tablet Commonly known as:  ZOFRAN Take 1 tablet (4 mg total) by mouth every 8 (eight) hours as needed for nausea or vomiting.   oxyCODONE-acetaminophen 5-325 MG tablet Commonly known as:  ROXICET Take 1-2 tablets by mouth every 4 (four) hours as needed for severe pain.   pantoprazole 40 MG tablet Commonly known as:  PROTONIX Take 40 mg by mouth daily.   rivaroxaban 10 MG Tabs tablet Commonly known as:  XARELTO Take 1 tablet (10 mg total) by mouth daily. For 30 days for DVT prophylaxis       Diagnostic Studies: Dg C-arm 1-60 Min  Result Date: 12/09/2015 CLINICAL DATA:  Right hip arthroplasty EXAM: OPERATIVE right HIP (WITH PELVIS IF PERFORMED) 2 VIEWS TECHNIQUE: Fluoroscopic spot image(s) were submitted for interpretation post-operatively. COMPARISON:  None. FINDINGS: Two views of the right hip submitted. There is right hip prosthesis with anatomic alignment. Fluoroscopy time 11 seconds. Please see the operative report. IMPRESSION: Right hip prosthesis with anatomic alignment. Electronically Signed   By: Lahoma Crocker M.D.   On: 12/09/2015 09:53   Dg Hip Operative Unilat With Pelvis Right  Result Date: 12/09/2015 CLINICAL DATA:  Right hip arthroplasty EXAM: OPERATIVE right HIP (WITH PELVIS IF PERFORMED) 2 VIEWS TECHNIQUE: Fluoroscopic spot image(s) were submitted for interpretation post-operatively. COMPARISON:  None. FINDINGS: Two views of the right hip submitted. There is right hip prosthesis with anatomic alignment. Fluoroscopy time 11 seconds. Please see the operative report. IMPRESSION: Right hip prosthesis with anatomic alignment. Electronically Signed   By: Lahoma Crocker M.D.   On: 12/09/2015 09:53    Disposition: Home   Follow-up Information    MURPHY, TIMOTHY D, MD. Schedule an appointment  as soon as possible for a visit in 2 week(s).   Specialty:  Orthopedic Surgery Contact information: [address] [address] [phone]            Signed: Prudencio Burly III PA-C 12/10/2015, 6:37 AM

## 2015-12-10 NOTE — Progress Notes (Signed)
Pt discharged to home.  Discharge instructions explained to pt.  Pt has no questions at the time of discharge.  IV removed.  Pt states she has all belongings.  Pt taken off unit via wheelchair by volunteer services.

## 2016-03-02 ENCOUNTER — Other Ambulatory Visit: Payer: Self-pay | Admitting: Gastroenterology

## 2016-04-30 ENCOUNTER — Encounter (HOSPITAL_COMMUNITY): Payer: Self-pay | Admitting: *Deleted

## 2016-05-04 ENCOUNTER — Ambulatory Visit (HOSPITAL_COMMUNITY): Payer: Medicare Other | Admitting: Anesthesiology

## 2016-05-04 ENCOUNTER — Encounter (HOSPITAL_COMMUNITY): Payer: Self-pay

## 2016-05-04 ENCOUNTER — Ambulatory Visit (HOSPITAL_COMMUNITY)
Admission: RE | Admit: 2016-05-04 | Discharge: 2016-05-04 | Disposition: A | Discharge status: Home / Self Care | Payer: Medicare Other | Source: Ambulatory Visit | Attending: Gastroenterology | Admitting: Gastroenterology

## 2016-05-04 ENCOUNTER — Encounter (HOSPITAL_COMMUNITY)
Admission: RE | Disposition: A | Discharge status: Home / Self Care | Payer: Self-pay | Source: Ambulatory Visit | Attending: Gastroenterology

## 2016-05-04 DIAGNOSIS — K219 Gastro-esophageal reflux disease without esophagitis: Secondary | ICD-10-CM | POA: Insufficient documentation

## 2016-05-04 DIAGNOSIS — Z96641 Presence of right artificial hip joint: Secondary | ICD-10-CM | POA: Diagnosis not present

## 2016-05-04 DIAGNOSIS — K648 Other hemorrhoids: Secondary | ICD-10-CM | POA: Diagnosis not present

## 2016-05-04 DIAGNOSIS — G4730 Sleep apnea, unspecified: Secondary | ICD-10-CM | POA: Diagnosis not present

## 2016-05-04 DIAGNOSIS — Z79899 Other long term (current) drug therapy: Secondary | ICD-10-CM | POA: Insufficient documentation

## 2016-05-04 DIAGNOSIS — K5730 Diverticulosis of large intestine without perforation or abscess without bleeding: Secondary | ICD-10-CM | POA: Insufficient documentation

## 2016-05-04 DIAGNOSIS — K317 Polyp of stomach and duodenum: Secondary | ICD-10-CM | POA: Diagnosis not present

## 2016-05-04 DIAGNOSIS — I10 Essential (primary) hypertension: Secondary | ICD-10-CM | POA: Diagnosis not present

## 2016-05-04 DIAGNOSIS — K449 Diaphragmatic hernia without obstruction or gangrene: Secondary | ICD-10-CM | POA: Insufficient documentation

## 2016-05-04 DIAGNOSIS — E785 Hyperlipidemia, unspecified: Secondary | ICD-10-CM | POA: Diagnosis not present

## 2016-05-04 DIAGNOSIS — Z6830 Body mass index (BMI) 30.0-30.9, adult: Secondary | ICD-10-CM | POA: Insufficient documentation

## 2016-05-04 DIAGNOSIS — D122 Benign neoplasm of ascending colon: Secondary | ICD-10-CM | POA: Insufficient documentation

## 2016-05-04 DIAGNOSIS — Z1211 Encounter for screening for malignant neoplasm of colon: Secondary | ICD-10-CM | POA: Insufficient documentation

## 2016-05-04 DIAGNOSIS — Z86010 Personal history of colonic polyps: Secondary | ICD-10-CM | POA: Insufficient documentation

## 2016-05-04 HISTORY — PX: ESOPHAGOGASTRODUODENOSCOPY (EGD) WITH PROPOFOL: SHX5813

## 2016-05-04 HISTORY — PX: COLONOSCOPY WITH PROPOFOL: SHX5780

## 2016-05-04 SURGERY — COLONOSCOPY WITH PROPOFOL
Anesthesia: Monitor Anesthesia Care

## 2016-05-04 MED ORDER — SODIUM CHLORIDE 0.9 % IV SOLN: INTRAVENOUS | Status: DC

## 2016-05-04 MED ORDER — LACTATED RINGERS IV SOLN
INTRAVENOUS | Status: DC
  Administered 2016-05-04: 1000 mL via INTRAVENOUS

## 2016-05-04 MED ORDER — PROPOFOL 500 MG/50ML IV EMUL
INTRAVENOUS | Status: DC | PRN
Start: 2016-05-04 — End: 2016-05-04
  Administered 2016-05-04: 150 ug/kg/min via INTRAVENOUS

## 2016-05-04 MED ORDER — PROPOFOL 500 MG/50ML IV EMUL
INTRAVENOUS | Status: DC | PRN
  Administered 2016-05-04: 50 mg via INTRAVENOUS

## 2016-05-04 MED ORDER — PROPOFOL 10 MG/ML IV BOLUS
INTRAVENOUS | Status: AC
  Filled 2016-05-04: qty 40

## 2016-05-04 SURGICAL SUPPLY — 24 items

## 2016-05-04 NOTE — H&P (Signed)
Stephanie Chavez is an 71 y.o. female.   Chief Complaint: Colorectal cancer screening. HPI: 71 year old white female here for a screening colonoscopy as she has severe sleep apnea. She office notes for details.  Past Medical History:  Diagnosis Date  . Arthritis   . Atrial flutter ([place])   . Chronic back pain   . Environmental allergies   . GERD (gastroesophageal reflux disease)    takes Protonix daily  . History of colon polyps    benign  . Hyperlipidemia    takes Lipitor daily  . Hypertension    takes Bisoprolol and Hyzaar daily  . Joint pain   . Skin cancer   . Sleep apnea    uses cpap does not know settings  . Thyroid nodule   . Weakness    numbness-hands    Past Surgical History:  Procedure Laterality Date  . BASAL CELL CARCINOMA EXCISION     upper chest  . BREAST BIOPSY     x 3  . BREAST SURGERY Bilateral   . CERVICAL FUSION    . COLONOSCOPY    . DILATION AND CURETTAGE OF UTERUS    . ESOPHAGOGASTRODUODENOSCOPY    . stage 1 melanoma removed from upper chest   10/2015  . TOTAL HIP ARTHROPLASTY Right 12/09/2015   Procedure: RIGHT TOTAL HIP ARTHROPLASTY ANTERIOR APPROACH;  Surgeon: Renette Butters, MD;  Location: [facility];  Service: Orthopedics;  Laterality: Right;   Family History  Problem Relation Age of Onset  . Heart attack Father    Social History:  reports that she has never smoked. She has never used smokeless tobacco. She reports that she drinks alcohol. She reports that she does not use drugs.  Allergies: No Known Allergies  Medications Prior to Admission  Medication Sig Dispense Refill  . acetaminophen (TYLENOL) 650 MG CR tablet Take 1,300 mg by mouth daily.     Marland Kitchen atorvastatin (LIPITOR) 80 MG tablet Take 80 mg by mouth at bedtime.    . bisoprolol (ZEBETA) 5 MG tablet Take 5 mg by mouth at bedtime.     Marland Kitchen losartan-hydrochlorothiazide (HYZAAR) 100-12.5 MG tablet Take 1 tablet by mouth daily.    . Multiple Vitamin (MULTIVITAMIN WITH MINERALS) TABS tablet Take  1 tablet by mouth daily.    . pantoprazole (PROTONIX) 40 MG tablet Take 40 mg by mouth daily.    Marland Kitchen docusate sodium (COLACE) 100 MG capsule Take 1 capsule (100 mg total) by mouth 2 (two) times daily. To prevent constipation while taking pain medication. (Patient not taking: Reported on 04/29/2016) 60 capsule 0  . ondansetron (ZOFRAN) 4 MG tablet Take 1 tablet (4 mg total) by mouth every 8 (eight) hours as needed for nausea or vomiting. (Patient not taking: Reported on 04/29/2016) 40 tablet 0  . oxyCODONE-acetaminophen (ROXICET) 5-325 MG tablet Take 1-2 tablets by mouth every 4 (four) hours as needed for severe pain. (Patient not taking: Reported on 04/29/2016) 60 tablet 0  . rivaroxaban (XARELTO) 10 MG TABS tablet Take 1 tablet (10 mg total) by mouth daily. For 30 days for DVT prophylaxis (Patient not taking: Reported on 04/29/2016) 30 tablet 0    No results found for this or any previous visit (from the past 48 hour(s)). No results found.  Review of Systems  All other systems reviewed and are negative.   Blood pressure 135/69, pulse (!) 57, temperature 98 F (36.7 C), temperature source Oral, resp. rate 13, height 5' 1.5" (1.562 m), weight 73.5 kg (162  lb), SpO2 99 %. Physical Exam  Constitutional: She is oriented to person, place, and time. She appears well-developed and well-nourished.  HENT:  Head: Normocephalic and atraumatic.  Eyes: Conjunctivae and EOM are normal. Pupils are equal, round, and reactive to light.  Neck: Normal range of motion. Neck supple.  Cardiovascular: Normal rate and regular rhythm.   Respiratory: Effort normal and breath sounds normal.  GI: Soft. Bowel sounds are normal.  Neurological: She is alert and oriented to person, place, and time.  Skin: Skin is warm and dry.  Psychiatric: She has a normal mood and affect. Her behavior is normal. Judgment and thought content normal.    Assessment/Plan 1) Colorectal cancer screening: Proceed with a colonoscopy at this  time.  2) GERD-EGD planned as well.  Emilly Lavey, MD 05/04/2016, 7:10 AM

## 2016-05-04 NOTE — Anesthesia Preprocedure Evaluation (Addendum)
Anesthesia Evaluation  Patient identified by MRN, date of birth, ID band Patient awake    Reviewed: Allergy & Precautions, NPO status , Patient's Chart, lab work & pertinent test results  Airway Mallampati: III  TM Distance: >3 FB Neck ROM: Full    Dental  (+) Teeth Intact   Pulmonary sleep apnea ,    breath sounds clear to auscultation       Cardiovascular hypertension,  Rhythm:Regular Rate:Normal     Neuro/Psych negative neurological ROS     GI/Hepatic GERD  ,  Endo/Other  Morbid obesity  Renal/GU      Musculoskeletal  (+) Arthritis ,   Abdominal (+) + obese,   Peds  Hematology   Anesthesia Other Findings   Reproductive/Obstetrics                             Anesthesia Physical Anesthesia Plan  ASA: III  Anesthesia Plan: MAC   Post-op Pain Management:    Induction: Intravenous  Airway Management Planned: Simple Face Mask and Natural Airway  Additional Equipment:   Intra-op Plan:   Post-operative Plan: Extubation in OR  Informed Consent: I have reviewed the patients History and Physical, chart, labs and discussed the procedure including the risks, benefits and alternatives for the proposed anesthesia with the patient or authorized representative who has indicated his/her understanding and acceptance.   Dental advisory given  Plan Discussed with: CRNA  Anesthesia Plan Comments:         Anesthesia Quick Evaluation

## 2016-05-04 NOTE — Op Note (Signed)
[facility] Patient Name: Stephanie Chavez Procedure Date: 05/04/2016 MRN: [id number] Attending MD: Juanita Craver , MD Date of Birth: [birth year] CSN: [id number] Age: 71 Admit Type: Outpatient Procedure:                EGD with biopsies. Indications:              Gastro-esophageal reflux disease. Providers:                Juanita Craver, MD, Carolynn Comment, RN, Cherylynn Ridges, Technician, Rosario Adie, CRNA. Referring MD:              Medicines:                Monitored anesthesia care Complications:            No immediate complications. Estimated Blood Loss:     Estimated blood loss: none. Procedure:                Pre-anesthesia assessment: - Prior to the                            procedure, a history and physical was performed,                            and patient medications and allergies were                            reviewed. The patient's tolerance of previous                            anesthesia was also reviewed. The risks and                            benefits of the procedure and the sedation options                            and risks were discussed with the patient. All                            questions were answered, and informed consent was                            obtained. Prior anticoagulants: The patient has                            taken no previous anticoagulant or antiplatelet                            agents. ASA Grade assessment: III - A patient with                            severe systemic disease. After reviewing the risks  and benefits, the patient was deemed in                            satisfactory condition to undergo the procedure.                            After obtaining informed consent, the endoscope was                            passed under direct vision. Throughout the                            procedure, the patient's blood pressure, pulse, and                 oxygen saturations were monitored continuously. The                            was introduced through the mouth, and advanced to                            the second part of duodenum. The EGD was                            accomplished without difficulty. The patient                            tolerated the procedure well. Scope In: Scope Out: Findings:      The examined esophagus and the GEJ appeared widely patent and normal.      A few 6 mm sessile polyps with no bleeding and no stigmata of recent       bleeding were found in the gastric body-a few of these were biopsied for       pathology.      A small hiatal hernia was noted on retroflexion.      The examined duodenum was normal. Impression:               - Normal appearing esophagus. and GEJ.                           - A few gastric polyps in the body-biopsied for                            pathology.                           - Small hiatal hernia noted on retroflexion;                            otherwise appearing stomach.                           - Normal examined duodenum. Moderate Sedation:      MAC given. Recommendation:           - High fiber, low fat diet with augmented water  consumption daily.                           - Continue present medications.                           - Await pathology results.                           - Return to my office PRN. Procedure Code(s):        --- Professional ---                           [phone], Esophagogastroduodenoscopy, flexible,                            transoral; with biopsy, single or multiple Diagnosis Code(s):        --- Professional ---                           K21.9, Gastro-esophageal reflux disease without                            esophagitis                           K44.9, Diaphragmatic hernia without obstruction or                            gangrene                           K31.7, Polyp of stomach and duodenum CPT  copyright 2016 [employer]. All rights reserved. The codes documented in this report are preliminary and upon coder review may  be revised to meet current compliance requirements. Juanita Craver, MD Juanita Craver, MD 05/04/2016 8:04:10 AM This report has been signed electronically. Number of Addenda: 0

## 2016-05-04 NOTE — Op Note (Signed)
[facility] Patient Name: Stephanie Chavez Procedure Date: 05/04/2016 MRN: [id number] Attending MD: Juanita Craver , MD Date of Birth: 1945/10/09 CSN: [id number] Age: 71 Admit Type: Outpatient Procedure:                Colonoscopy with cold biopsy x 1. Indications:              CRC screening for colorectal malignant neoplasm;                            Personal history of colonic polyps. Providers:                Juanita Craver, MD, Carolynn Comment, RN, Cherylynn Ridges, Technician, Rosario Adie, CRNA. Referring MD:              Medicines:                Monitored anesthesia care Complications:            No immediate complications. Estimated Blood Loss:     Estimated blood loss: minimal. Procedure:                Pre-anesthesia assessment: - Prior to the                            procedure, a history and physical was performed,                            and patient medications and allergies were                            reviewed. The patient's tolerance of previous                            anesthesia was also reviewed. The risks and                            benefits of the procedure and the sedation options                            and risks were discussed with the patient. All                            questions were answered, and informed consent was                            obtained. Prior anticoagulants: The patient has                            taken no previous anticoagulant or antiplatelet                            agents. ASA Grade assessment: III - A patient with  severe systemic disease. After reviewing the risks                            and benefits, the patient was deemed in                            satisfactory condition to undergo the procedure.                            After obtaining informed consent, the colonoscope                            was passed under direct vision.  Throughout the                            procedure, the patient's blood pressure, pulse, and                            oxygen saturations were monitored continuously. The                            EC-[id number] ([id number]) scope was introduced through                            the anus and advanced to the the cecum, identified                            by appendiceal orifice and ileocecal valve. The                            colonoscopy was performed without difficulty. The                            patient tolerated the procedure well. The quality                            of the bowel preparation was good. The ileocecal                            valve, the appendiceal orifice and the rectum were                            photographed. The bowel preparation used was                            GoLYTELY. Scope In: 8:31:51 AM Scope Out: 7:49:55 AM Scope Withdrawal Time: 0 hours 9 minutes 19 seconds  Total Procedure Duration: 0 hours 13 minutes 36 seconds  Findings:      A dimunitive polyp was found in the distal ascending colon-this was       removed by cold biopsy x 1.      A couple of small-mouthed diverticula were found in the descending colon       and ascending colon.      Small internal hemorrhoids  were noted on retroflexion. Impression:               - One dimunitive polyp in the distal ascending                            colon-removed by cold biopsy x 1.                           - A couple of diverticula in the descending colon                            and in the ascending colon.                           - Small internal hemorrhoids. Moderate Sedation:      MAC given. Recommendation:           - High fiber, low fat diet with augmented water                            consumption daily.                           - Continue present medications.                           - Await pathology results.                           - Repeat colonoscopy in 7 years for  surveillance.                           - Return to GI office PRN.                           - If the patient has any abnormal GI symptoms in                            the interim, she has been advised to call the                            office ASAP for further recommendations. Procedure Code(s):        --- Professional ---                           [phone], Colonoscopy, flexible; with biopsy, single                            or multiple Diagnosis Code(s):        --- Professional ---                           D12.2, Benign neoplasm of ascending colon                           Z12.11, Encounter for screening for malignant  neoplasm of colon CPT copyright 2016 [employer]. All rights reserved. The codes documented in this report are preliminary and upon coder review may  be revised to meet current compliance requirements. Juanita Craver, MD Juanita Craver, MD 05/04/2016 8:10:36 AM This report has been signed electronically. Number of Addenda: 0

## 2016-05-04 NOTE — Discharge Instructions (Signed)

## 2016-05-04 NOTE — Transfer of Care (Signed)
Immediate Anesthesia Transfer of Care Note  Patient: Stephanie Chavez  Procedure(s) Performed: Procedure(s): COLONOSCOPY WITH PROPOFOL (N/A) ESOPHAGOGASTRODUODENOSCOPY (EGD) WITH PROPOFOL (N/A)  Patient Location: PACU  Anesthesia Type:MAC  Level of Consciousness: awake, alert  and oriented  Airway & Oxygen Therapy: Patient Spontanous Breathing and Patient connected to nasal cannula oxygen  Post-op Assessment: Report given to RN and Post -op Vital signs reviewed and stable  Post vital signs: Reviewed and stable  Last Vitals:  Vitals:   05/04/16 0626  BP: 135/69  Pulse: (!) 57  Resp: 13  Temp: 36.7 C    Last Pain:  Vitals:   05/04/16 0626  TempSrc: Oral         Complications: No apparent anesthesia complications

## 2016-05-04 NOTE — Anesthesia Postprocedure Evaluation (Addendum)
Anesthesia Post Note  Patient: Stephanie Chavez  Procedure(s) Performed: Procedure(s) (LRB): COLONOSCOPY WITH PROPOFOL (N/A) ESOPHAGOGASTRODUODENOSCOPY (EGD) WITH PROPOFOL (N/A)  Patient location during evaluation: Endoscopy Anesthesia Type: MAC Level of consciousness: awake and alert Pain management: pain level controlled Vital Signs Assessment: post-procedure vital signs reviewed and stable Respiratory status: spontaneous breathing, nonlabored ventilation, respiratory function stable and patient connected to nasal cannula oxygen Cardiovascular status: stable and blood pressure returned to baseline Anesthetic complications: no       Last Vitals:  Vitals:   05/04/16 0810 05/04/16 0820  BP: (!) 138/58 135/62  Pulse:  (!) 57  Resp:  18  Temp:      Last Pain:  Vitals:   05/04/16 0800  TempSrc: Oral                 Marquis Diles,JAMES TERRILL

## 2016-05-05 ENCOUNTER — Encounter (HOSPITAL_COMMUNITY): Payer: Self-pay | Admitting: Gastroenterology

## 2016-07-16 NOTE — Addendum Note (Signed)
Addendum  created 07/16/16 1239 by Rica Koyanagi, MD   Sign clinical note

## 2018-06-07 ENCOUNTER — Telehealth: Payer: Self-pay | Admitting: Cardiovascular Disease

## 2018-06-07 NOTE — Telephone Encounter (Signed)
Patient moved to [facility] declined to schedule appt.  Deleting recall per patient request
